# Patient Record
Sex: Female | Born: 1970 | Race: White | Hispanic: No | Marital: Married | Smoking: Never smoker
Health system: Southern US, Community
[De-identification: ages and names within clinical notes are randomized; demographics above are authoritative.]

## PROBLEM LIST (undated history)

## (undated) HISTORY — PX: LEEP: SHX91

## (undated) HISTORY — PX: BIOPSY BREAST: PRO8

---

## 2000-03-03 ENCOUNTER — Other Ambulatory Visit: Admission: RE | Admit: 2000-03-03 | Discharge: 2000-03-03 | Payer: Self-pay | Admitting: Obstetrics and Gynecology

## 2000-09-25 ENCOUNTER — Encounter: Payer: Self-pay | Admitting: *Deleted

## 2000-09-25 ENCOUNTER — Encounter: Admission: RE | Admit: 2000-09-25 | Discharge: 2000-09-25 | Payer: Self-pay | Admitting: *Deleted

## 2001-08-30 ENCOUNTER — Other Ambulatory Visit: Admission: RE | Admit: 2001-08-30 | Discharge: 2001-08-30 | Payer: Self-pay | Admitting: Obstetrics and Gynecology

## 2002-03-08 ENCOUNTER — Other Ambulatory Visit: Admission: RE | Admit: 2002-03-08 | Discharge: 2002-03-08 | Payer: Self-pay | Admitting: Gynecology

## 2002-11-08 ENCOUNTER — Other Ambulatory Visit: Admission: RE | Admit: 2002-11-08 | Discharge: 2002-11-08 | Payer: Self-pay | Admitting: Gynecology

## 2003-05-09 ENCOUNTER — Other Ambulatory Visit: Admission: RE | Admit: 2003-05-09 | Discharge: 2003-05-09 | Payer: Self-pay | Admitting: Gynecology

## 2003-11-14 ENCOUNTER — Other Ambulatory Visit: Admission: RE | Admit: 2003-11-14 | Discharge: 2003-11-14 | Payer: Self-pay | Admitting: Gynecology

## 2004-11-18 ENCOUNTER — Other Ambulatory Visit: Admission: RE | Admit: 2004-11-18 | Discharge: 2004-11-18 | Payer: Self-pay | Admitting: Gynecology

## 2005-11-25 ENCOUNTER — Other Ambulatory Visit: Admission: RE | Admit: 2005-11-25 | Discharge: 2005-11-25 | Payer: Self-pay | Admitting: Gynecology

## 2006-12-10 ENCOUNTER — Other Ambulatory Visit: Admission: RE | Admit: 2006-12-10 | Discharge: 2006-12-10 | Payer: Self-pay | Admitting: Gynecology

## 2007-12-14 ENCOUNTER — Other Ambulatory Visit: Admission: RE | Admit: 2007-12-14 | Discharge: 2007-12-14 | Payer: Self-pay | Admitting: Gynecology

## 2012-07-15 ENCOUNTER — Other Ambulatory Visit: Payer: Self-pay | Admitting: Gynecology

## 2012-07-15 DIAGNOSIS — R928 Other abnormal and inconclusive findings on diagnostic imaging of breast: Secondary | ICD-10-CM

## 2012-07-22 ENCOUNTER — Ambulatory Visit
Admission: RE | Admit: 2012-07-22 | Discharge: 2012-07-22 | Disposition: A | Discharge status: Home / Self Care | Payer: Commercial Indemnity | Source: Ambulatory Visit | Attending: Gynecology | Admitting: Gynecology

## 2012-07-22 DIAGNOSIS — R928 Other abnormal and inconclusive findings on diagnostic imaging of breast: Secondary | ICD-10-CM

## 2012-07-22 IMAGING — MG MM DIGITAL DIAGNOSTIC UNILAT*R*
2 series · 2 of 2 positions shown · non-contrast
Comparison: Previous examinations, including the screening
mammogram dated [DATE].

CLINICAL DATA: Right breast calcifications at recent screening
mammography.

DIGITAL DIAGNOSTIC RIGHT MAMMOGRAM

[R CC]
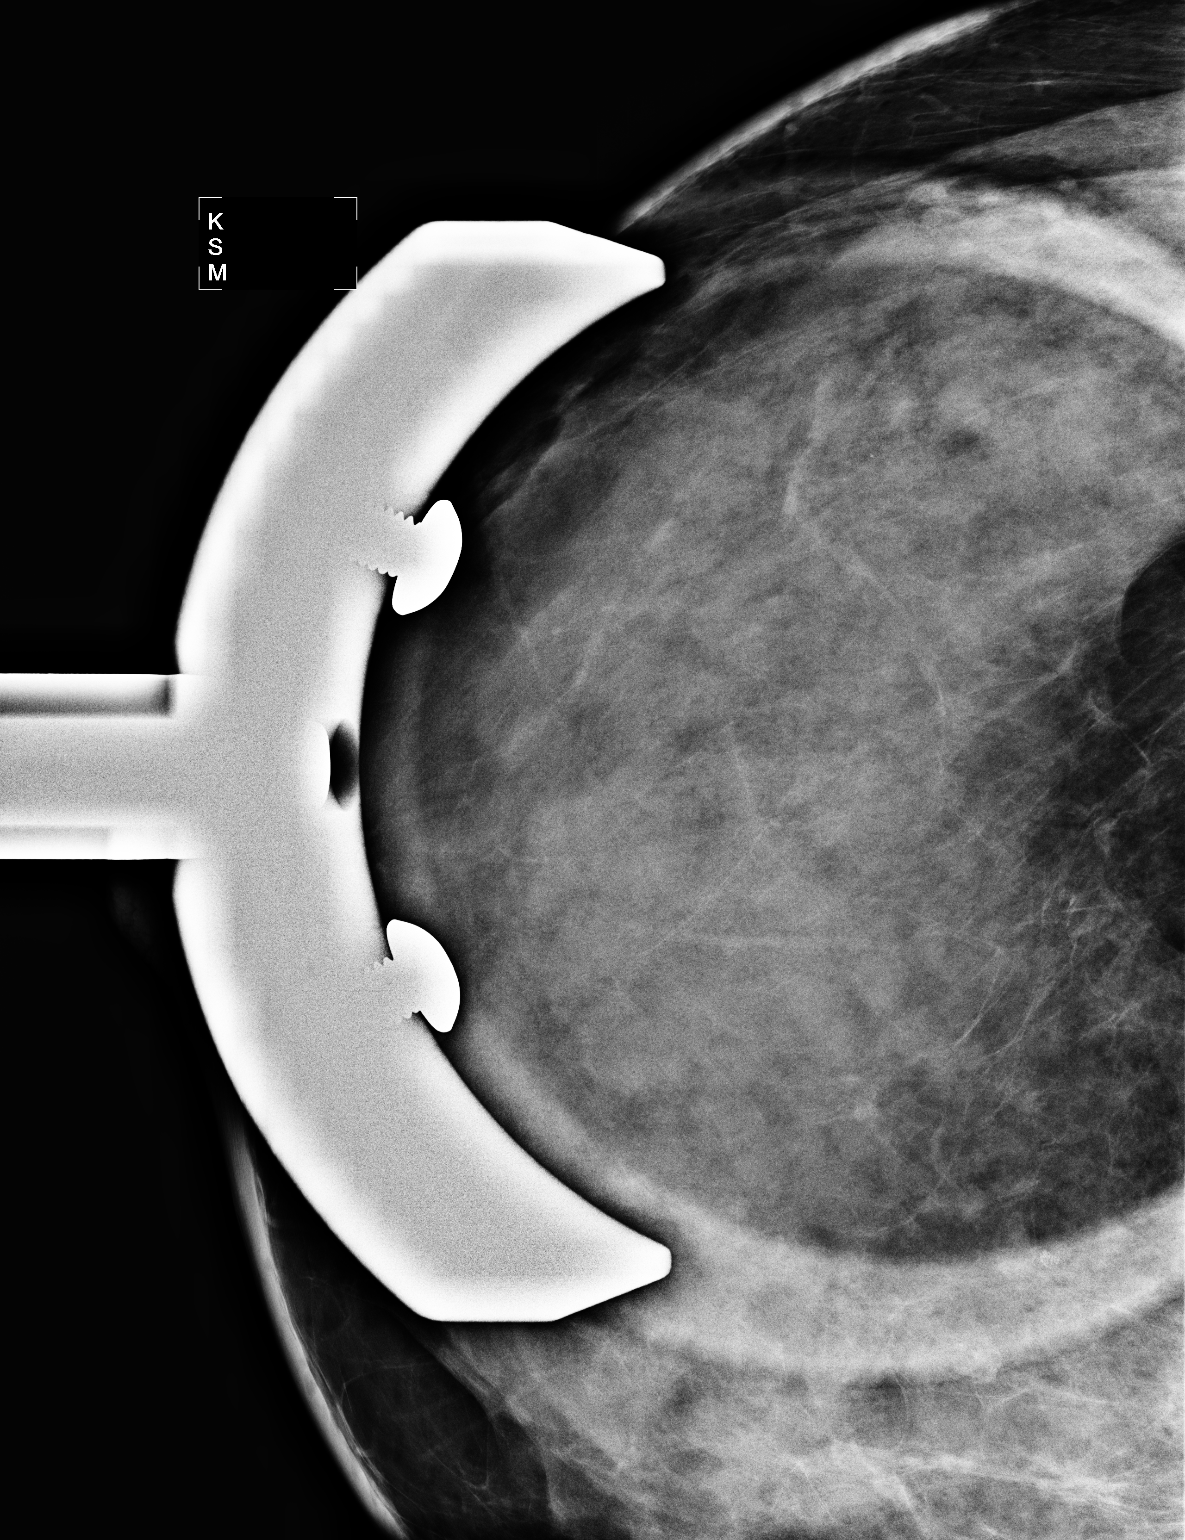

[R ML]
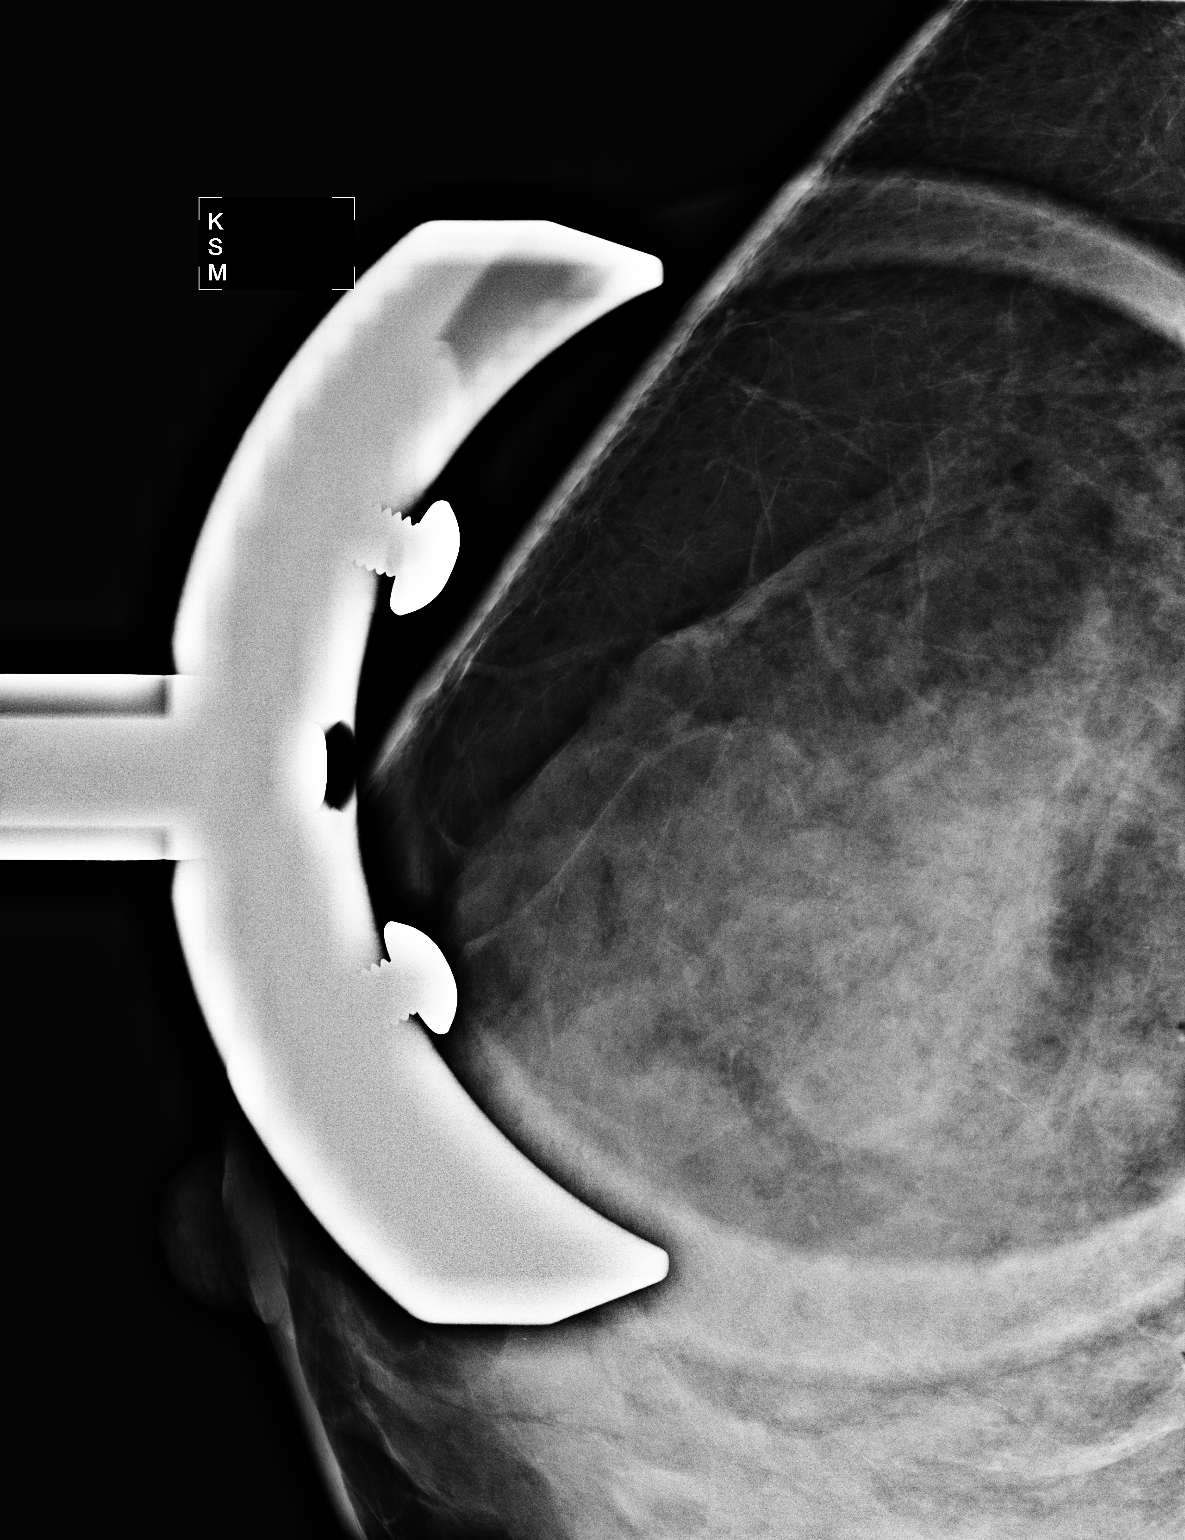

[2 of 2 positions shown; findings below may reference images not displayed]

FINDINGS: Spot magnification views of the right breast demonstrate
faint, loosely clustered, punctate, poorly defined rounded, tiny
calcifications in the upper outer portion of the breast.  These
demonstrate dependent layering on the recent oblique view,
compatible with milk of calcium.  No findings suspicious for
malignancy.
IMPRESSION: Benign right breast calcifications.  No evidence of malignancy.
Annual screening mammography is recommended.

RECOMMENDATION:
Bilateral screening mammogram in 1 year.

BI-RADS CATEGORY 2:  Benign finding(s).

## 2018-09-03 ENCOUNTER — Other Ambulatory Visit: Payer: Self-pay | Admitting: Obstetrics & Gynecology

## 2018-09-03 DIAGNOSIS — R921 Mammographic calcification found on diagnostic imaging of breast: Secondary | ICD-10-CM

## 2018-09-10 ENCOUNTER — Ambulatory Visit
Admission: RE | Admit: 2018-09-10 | Discharge: 2018-09-10 | Disposition: A | Discharge status: Home / Self Care | Payer: Managed Care, Other (non HMO) | Source: Ambulatory Visit | Attending: Obstetrics & Gynecology | Admitting: Obstetrics & Gynecology

## 2018-09-10 DIAGNOSIS — R921 Mammographic calcification found on diagnostic imaging of breast: Secondary | ICD-10-CM

## 2018-09-10 IMAGING — MG MM CLIP PLACEMENT
2 series · 2 of 2 positions shown · non-contrast
Comparison: Previous exam(s).

CLINICAL DATA: Calcifications were biopsied in the upper inner left
breast.

EXAM:
DIAGNOSTIC LEFT MAMMOGRAM POST STEREOTACTIC BIOPSY

[L CC]
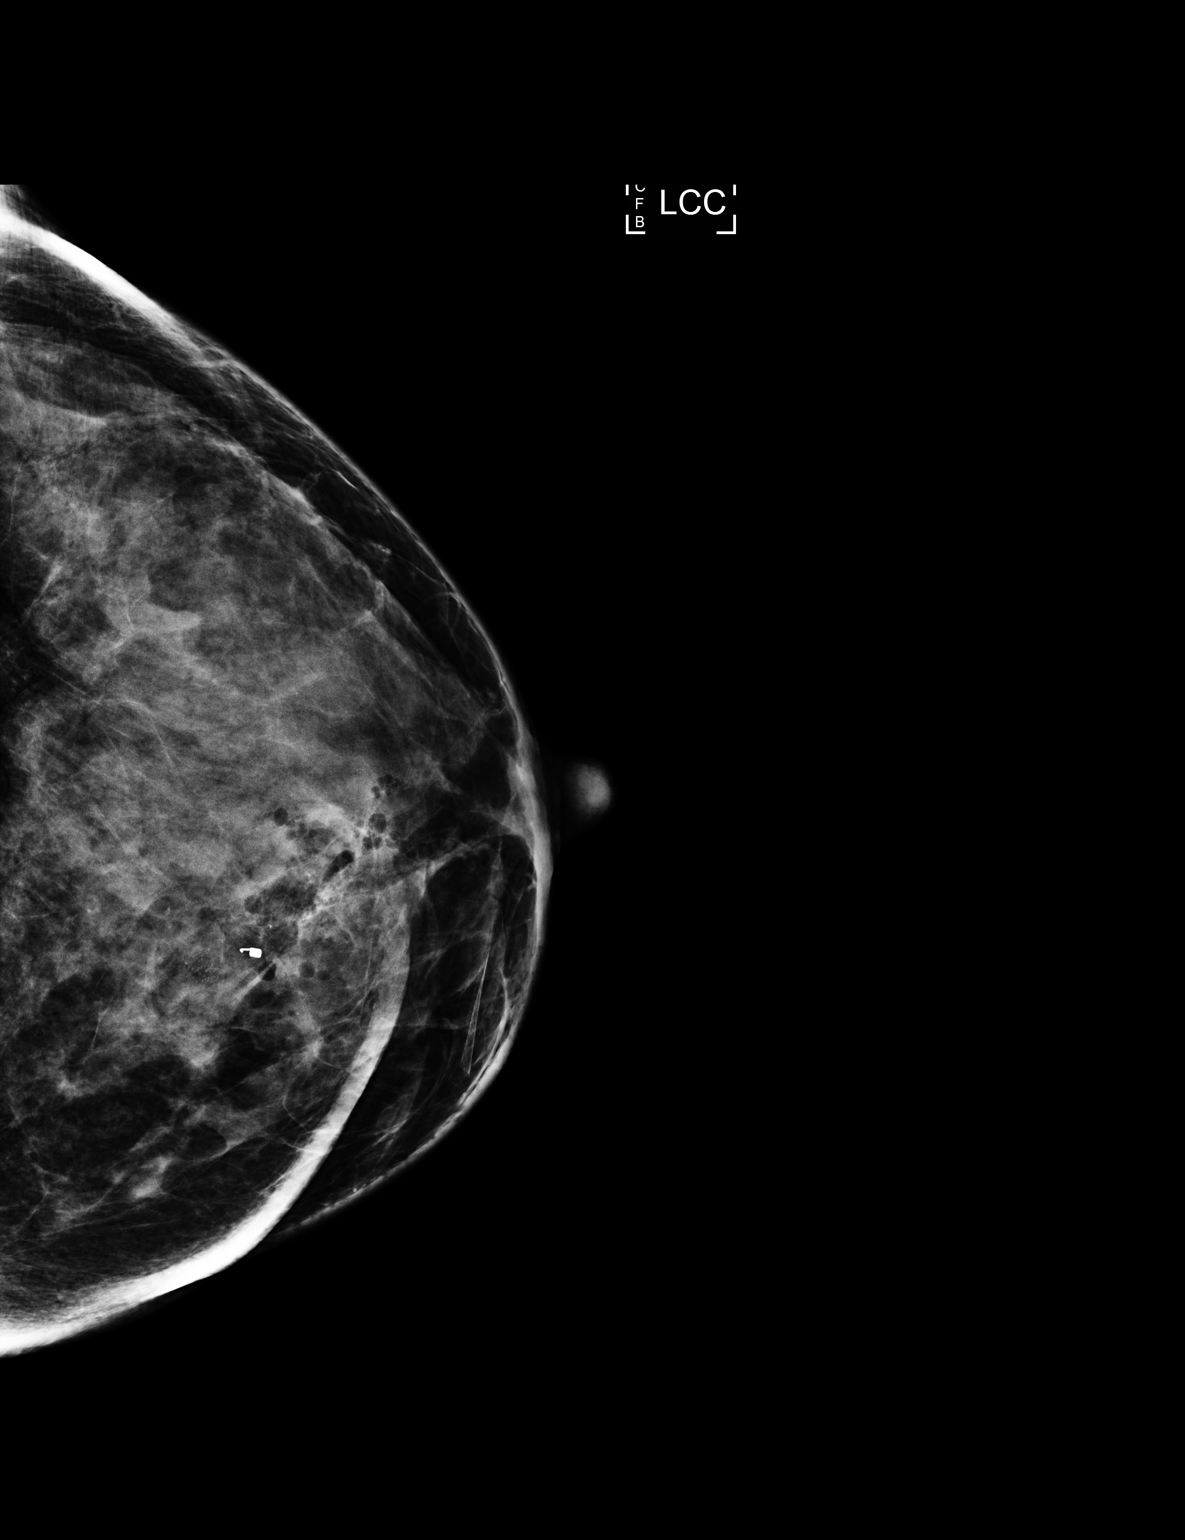

[L ML]
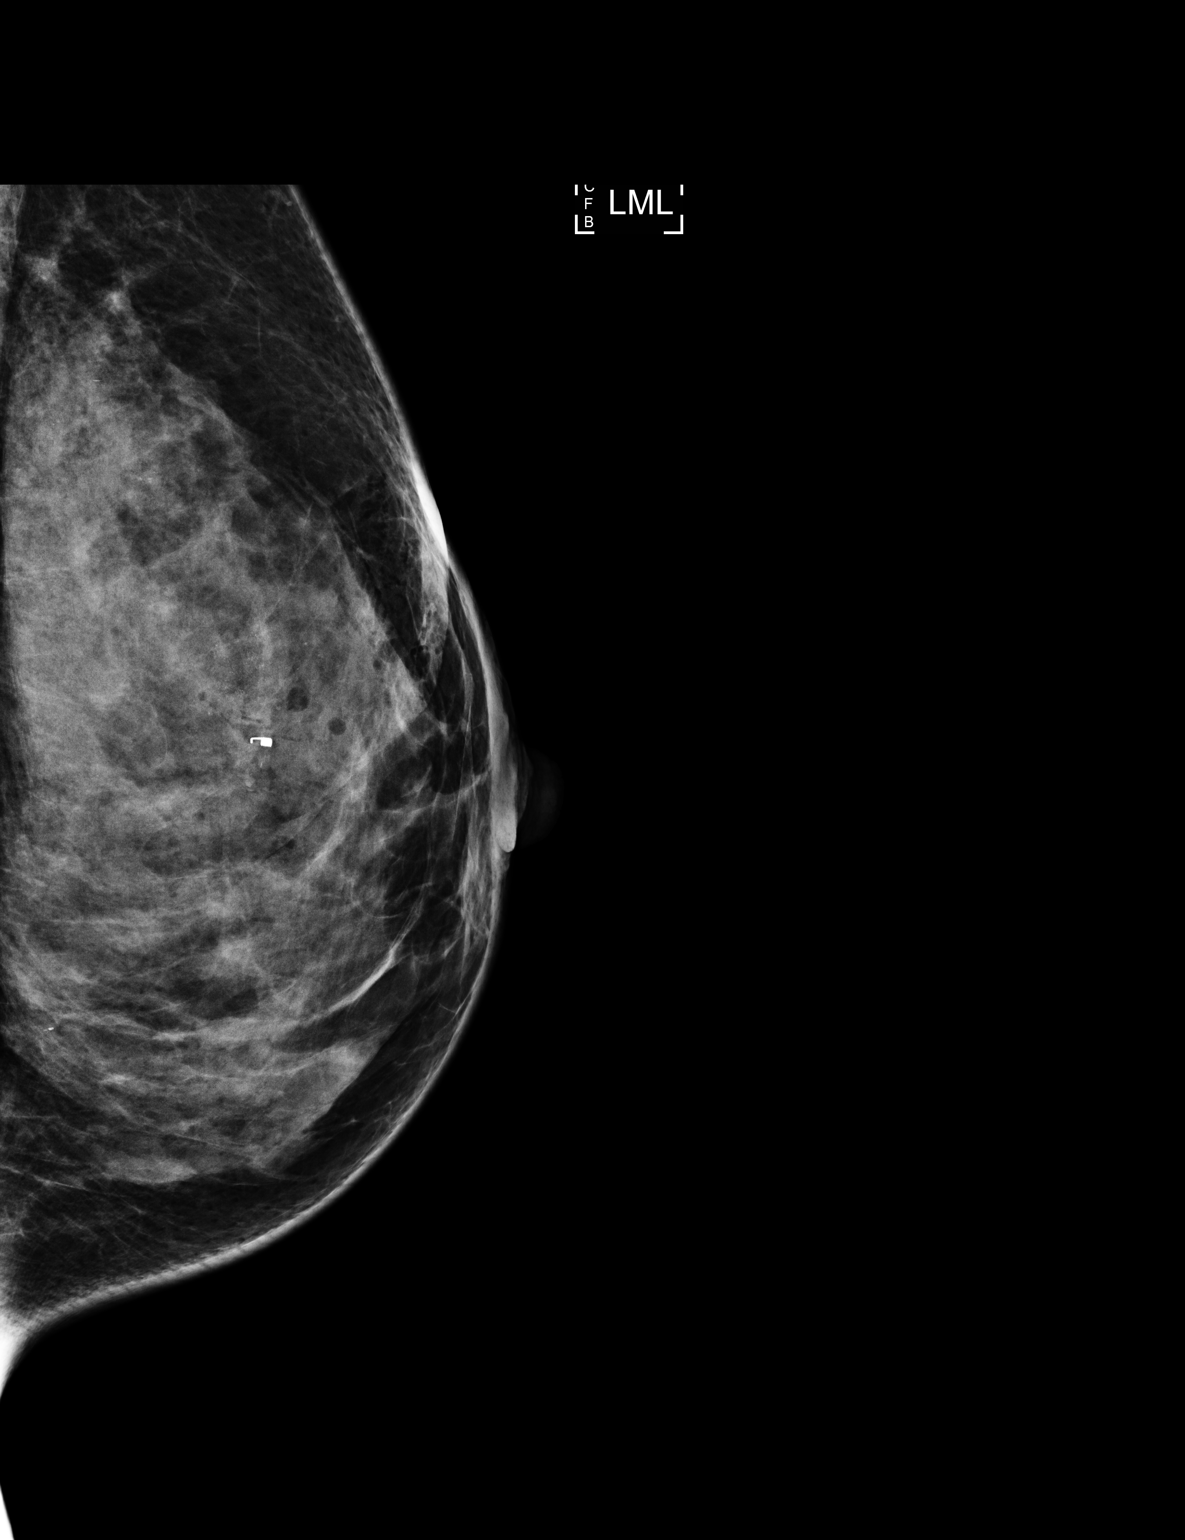

[2 of 2 positions shown; findings below may reference images not displayed]

FINDINGS: Mammographic images were obtained following stereotactic guided
biopsy of calcifications in the slightly upper inner left breast.
The coil shaped biopsy clip is in satisfactory position.
IMPRESSION: Satisfactory position of coil shaped biopsy clip in the left breast.

Final Assessment: Post Procedure Mammograms for Marker Placement

## 2018-09-10 IMAGING — MG STEREOTACTIC CORE NEEDLE BIOPSY
8 of 11 series · 8 of 15 positions shown · non-contrast
Comparison: Previous exams.

Addendum:
CLINICAL DATA: Stereotactic biopsy was performed of calcifications
in the slightly upper and inner left breast.

EXAM:
LEFT BREAST STEREOTACTIC CORE NEEDLE BIOPSY

[L (1 of 5)]
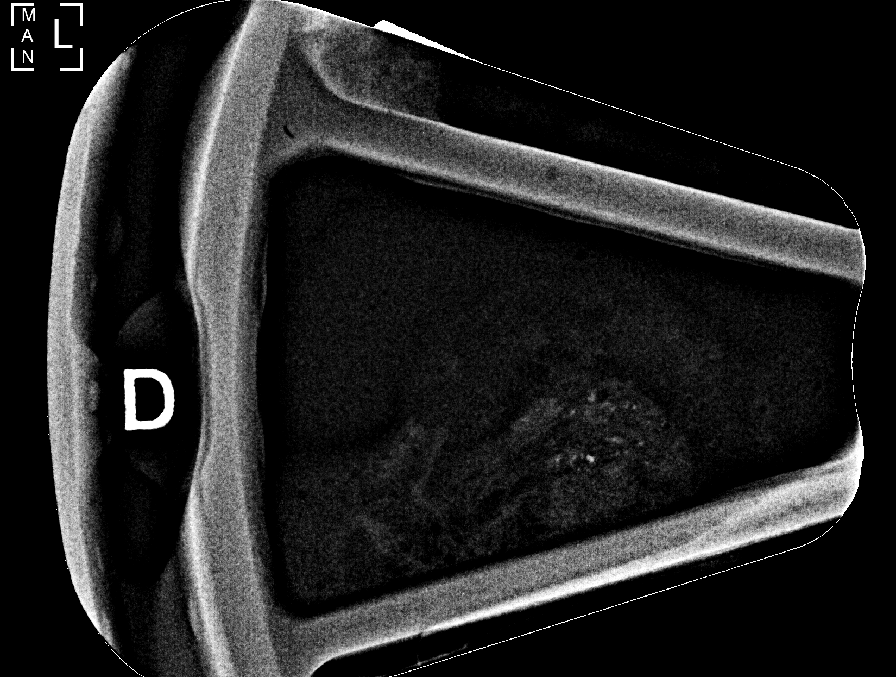

[L (2 of 5)]
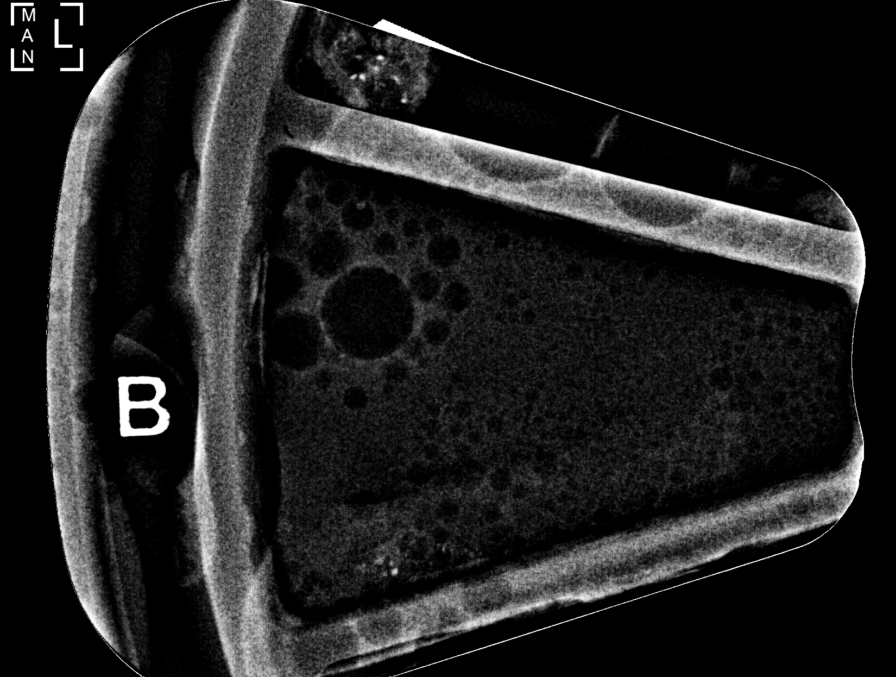

[L (3 of 5)]
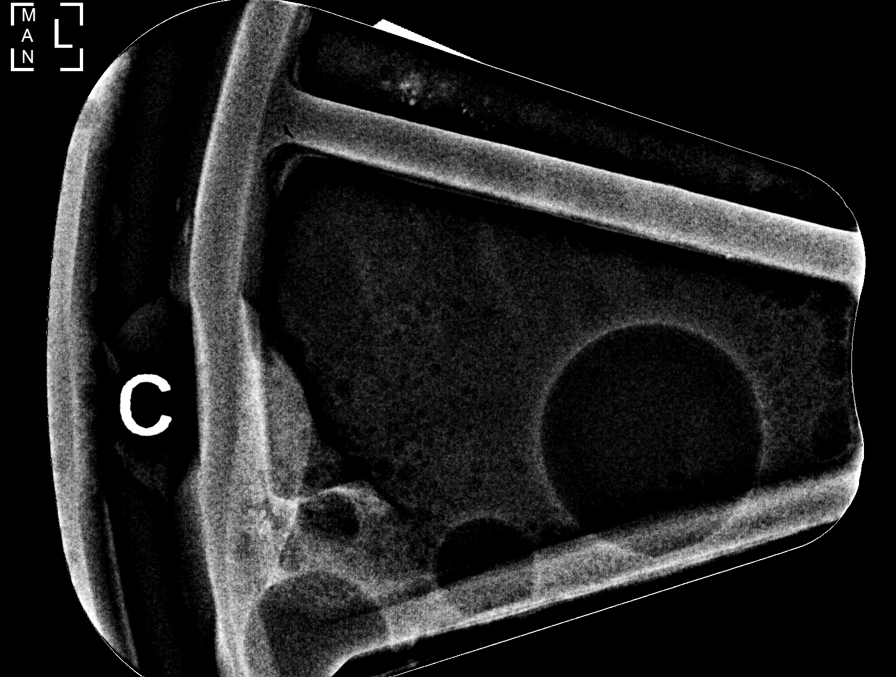

[L (4 of 5)]
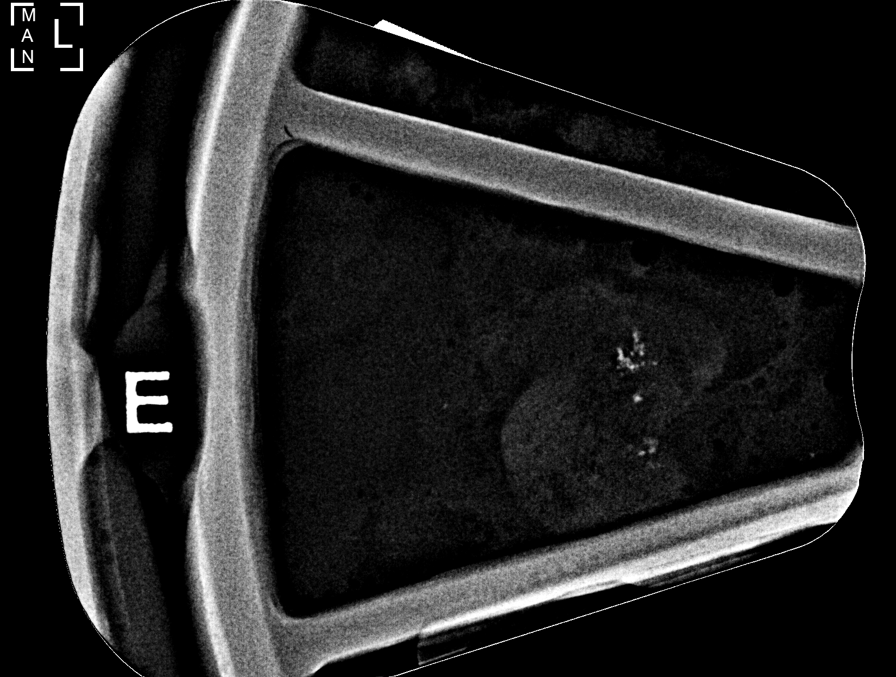

[L (5 of 5)]
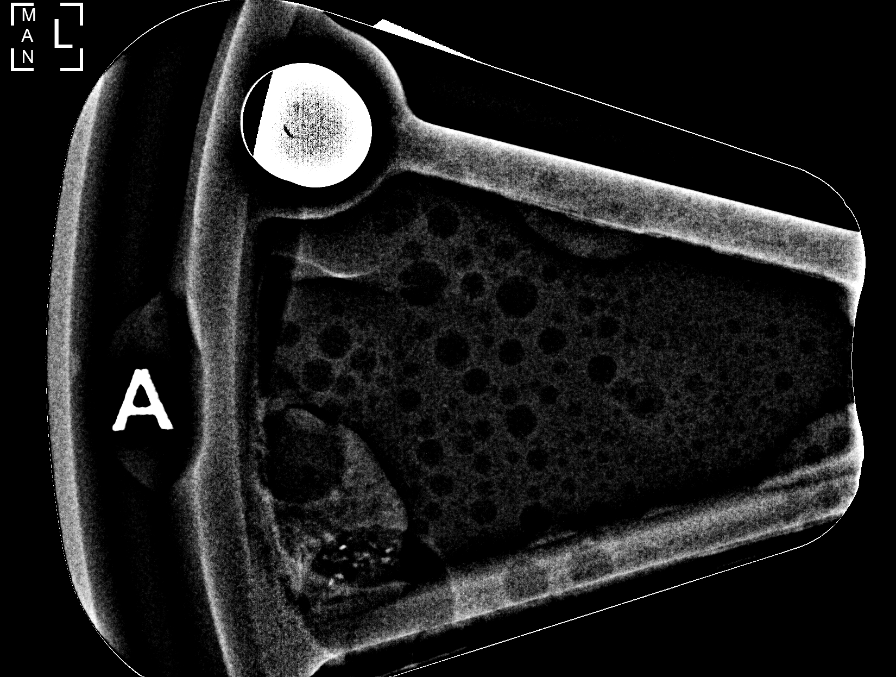

[L ML (1 of 3)]
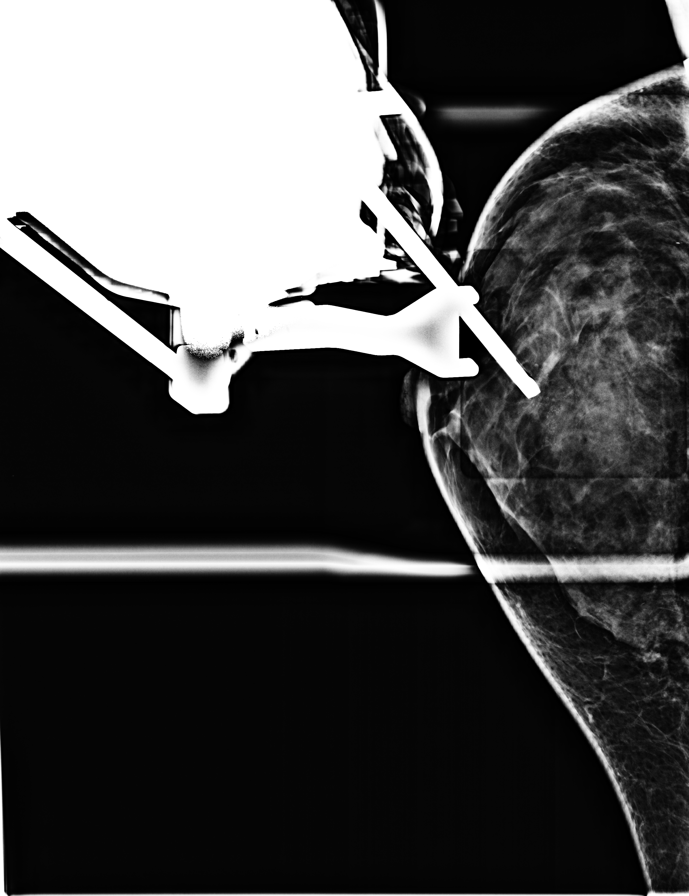

[L ML (2 of 3)]
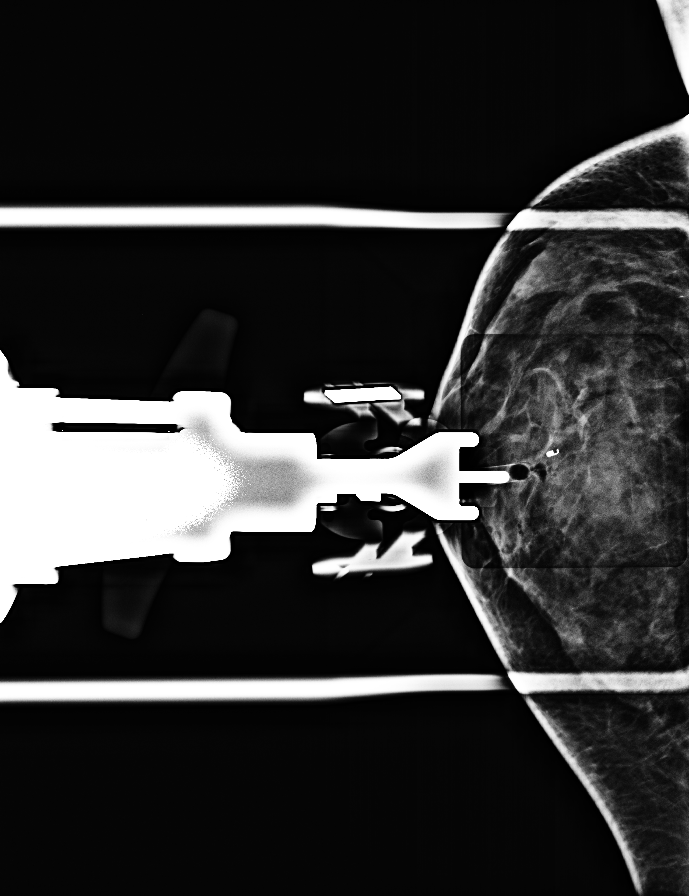

[L ML (3 of 3)]
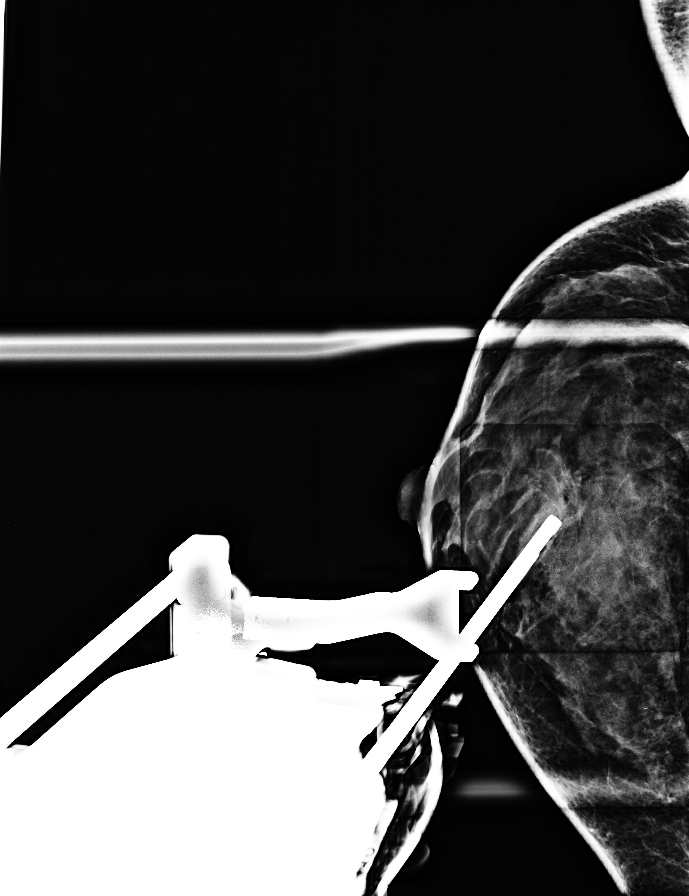

[8 of 15 positions shown; findings below may reference images not displayed]



Using sterile technique and 1% Lidocaine with and without
epinephrine as local anesthetic, under stereotactic guidance, a 9
gauge vacuum assisted device was used to perform core needle biopsy
of calcifications in the upper inner quadrant using a medial
approach. Specimen radiograph was performed showing multiple
calcifications. Specimens with calcifications are identified for
pathology.

Lesion quadrant: Upper inner quadrant

At the conclusion of the procedure, a coil tissue marker clip was
deployed into the biopsy cavity. Follow-up 2-view mammogram was
performed and dictated separately.
IMPRESSION: Stereotactic-guided biopsy of the left breast. No apparent
complications.

ADDENDUM:
Pathology revealed FIBROCYSTIC CHANGES WITH CALCIFICATIONS of the
Left breast, 9 o'clock region. This was found to be concordant by
Dr. AVEJI.

Pathology results were discussed with the patient by telephone by
AVEJI, [REDACTED] at [facility] [REDACTED]. The patient reported doing well after the biopsy with
tenderness at the site.

The patient was instructed to return for annual screening
mammography at [REDACTED] in [HOSPITAL][HOSPITAL].

Pathology results reported by AVEJI, RN on [DATE].

*** End of Addendum ***

## 2020-02-23 ENCOUNTER — Other Ambulatory Visit: Payer: Self-pay | Admitting: Obstetrics & Gynecology

## 2020-02-23 DIAGNOSIS — R922 Inconclusive mammogram: Secondary | ICD-10-CM

## 2020-03-12 ENCOUNTER — Ambulatory Visit
Admission: RE | Admit: 2020-03-12 | Discharge: 2020-03-12 | Disposition: A | Discharge status: Home / Self Care | Payer: No Typology Code available for payment source | Source: Ambulatory Visit | Attending: Obstetrics & Gynecology | Admitting: Obstetrics & Gynecology

## 2020-03-12 DIAGNOSIS — R922 Inconclusive mammogram: Secondary | ICD-10-CM

## 2020-03-12 IMAGING — MR MR BREAST WO/W CM  BILAT
5 series · 30 of 48 positions shown · IV contrast (gadavist)
Comparison: Prior mammograms.  [DATE] abdominal MR

CLINICAL DATA: 48-year-old female for abbreviated Breast MRI for
breast cancer screening.

LABS:  None performed today
EXAM:
BILATERAL ABBREVIATED BREAST MRI WITH AND WITHOUT CONTRAST
TECHNIQUE: Multiplanar, multisequence MR images of both breasts were obtained
prior to and following the intravenous administration of 7 ml of
Gadavist

[Series 2: t2_tirm_tra ipat (a-p) · axial · 3.0mm · 0.59mm/px · z∈[-71,+106]mm · 5 of 60 slices shown]
[im 1/60]
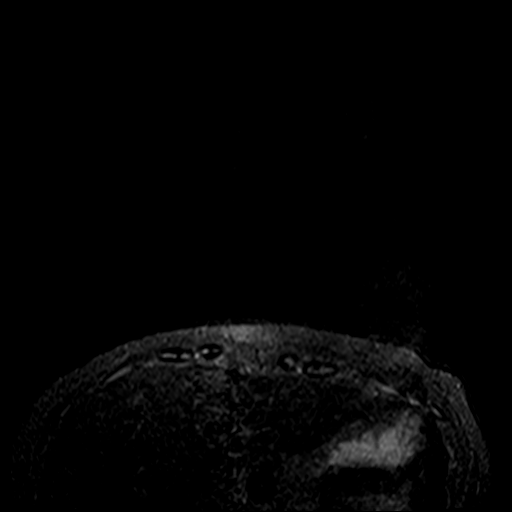
[im 15/60]
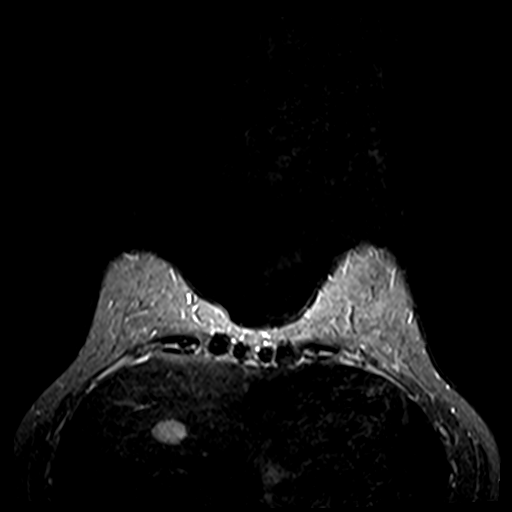
[im 30/60]
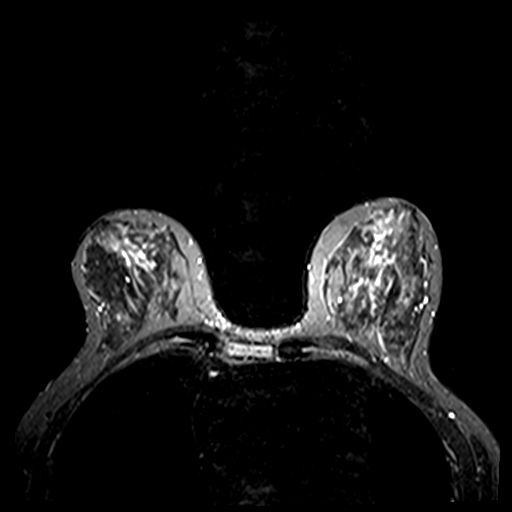
[im 45/60]
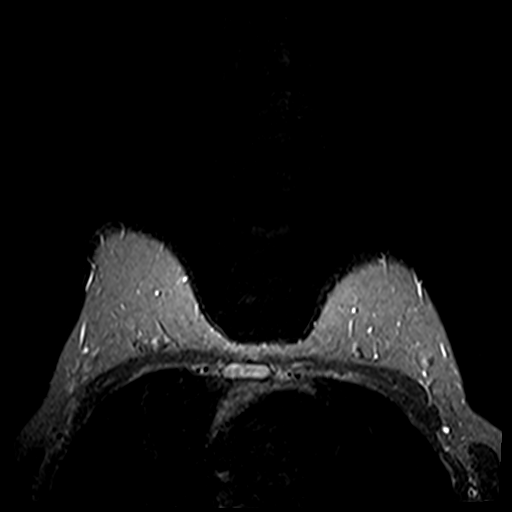
[im 60/60]
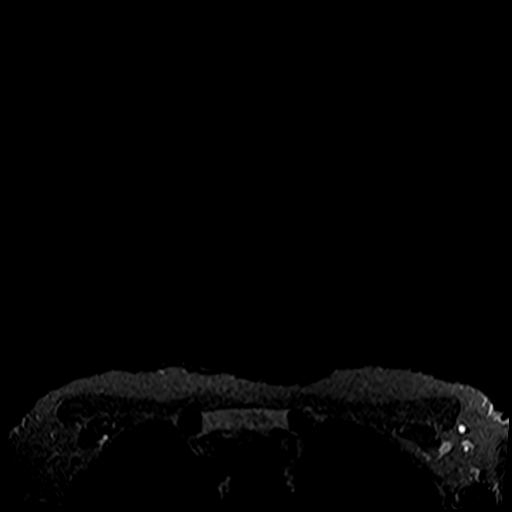

[Series 3: fl3d pre-cm · axial · non-contrast · 1.2mm · 0.78mm/px · z∈[-69,+103]mm · 8 of 144 slices shown]
[im 1/144]
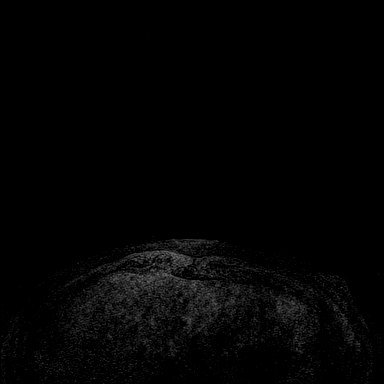
[im 23/144]
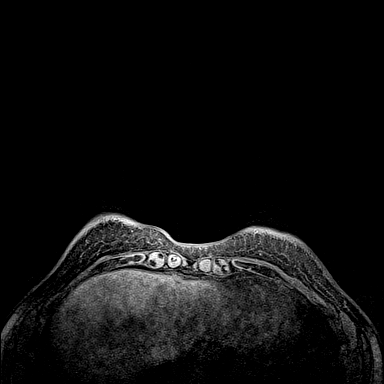
[im 45/144]
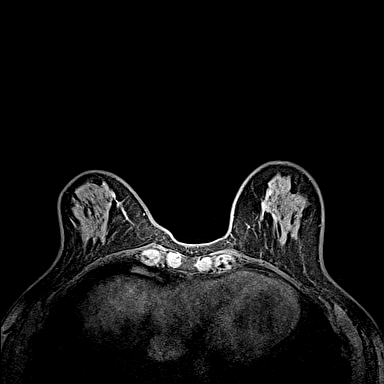
[im 67/144]
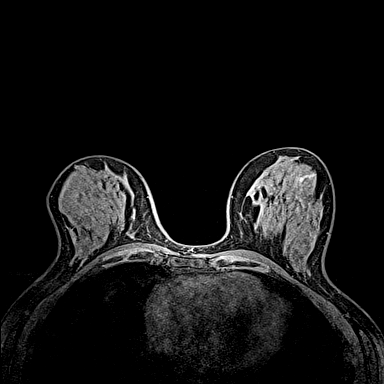
[im 78/144]
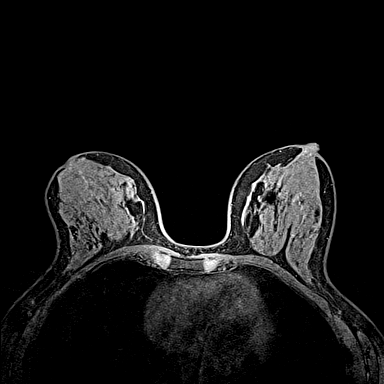
[im 100/144]
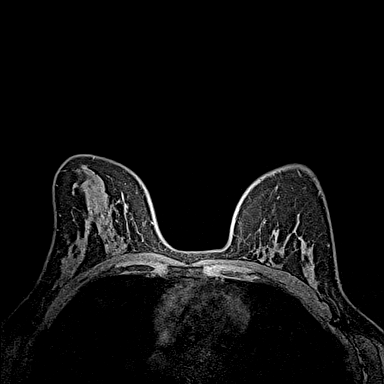
[im 122/144]
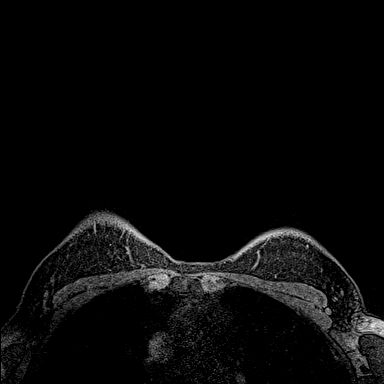
[im 144/144]
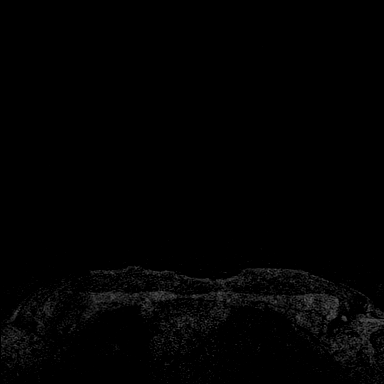

[Series 4: fl3d post-cm 20 · axial · 1.2mm · 0.78mm/px · z∈[-69,+103]mm · 8 of 144 slices shown (1 of 3)]
[im 1/144]
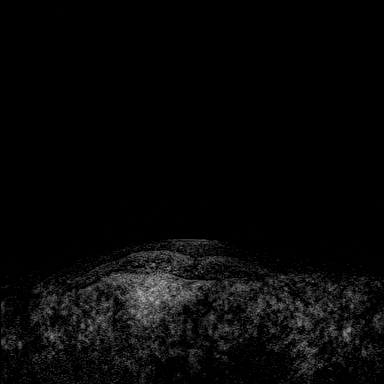
[im 23/144]
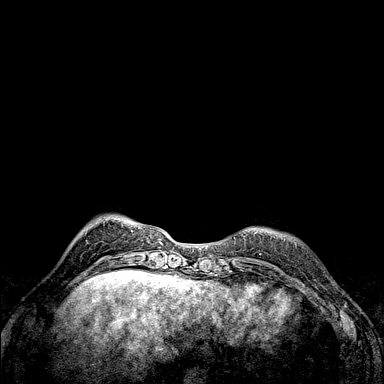
[im 45/144]
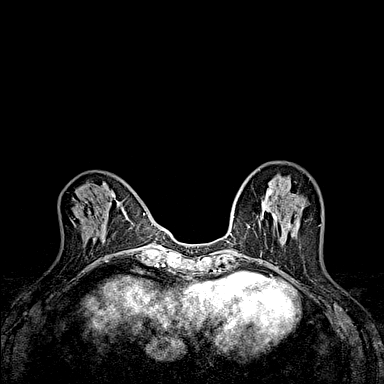
[im 67/144]
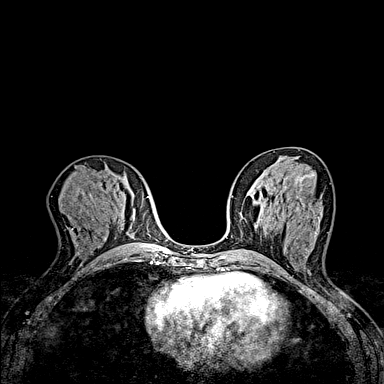
[im 78/144]
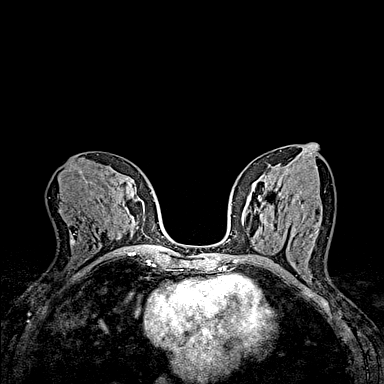
[im 100/144]
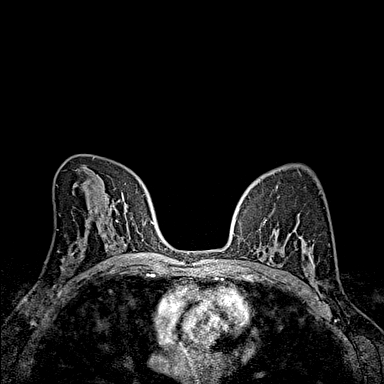
[im 122/144]
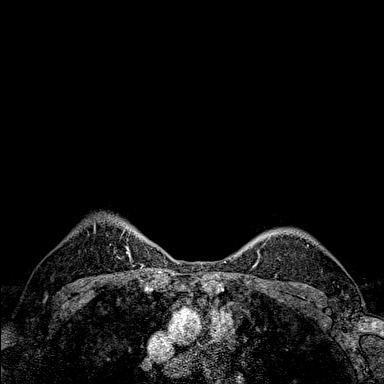
[im 144/144]
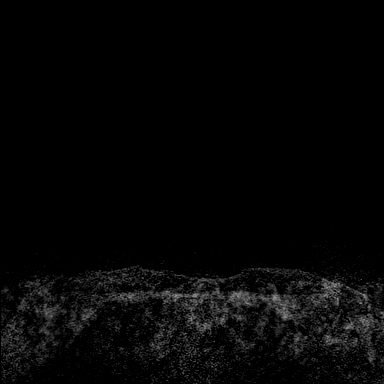

[Series 5: fl3d post-cm 20 · axial · 1.2mm · 0.78mm/px · z∈[-69,+103]mm · 8 of 144 slices shown (2 of 3)]
[im 1/144]
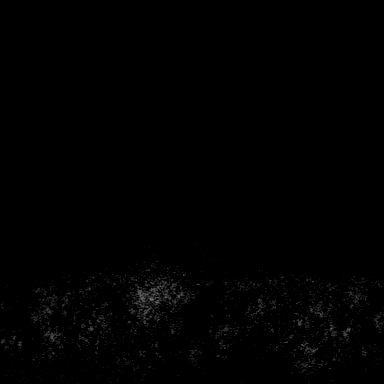
[im 23/144]
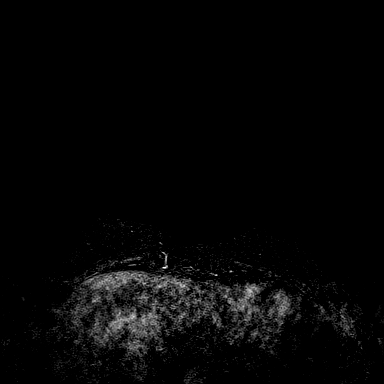
[im 45/144]
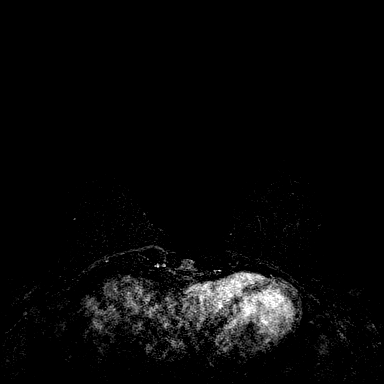
[im 67/144]
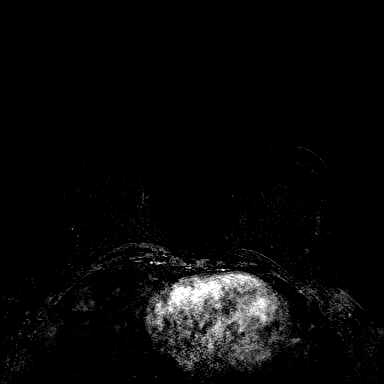
[im 78/144]
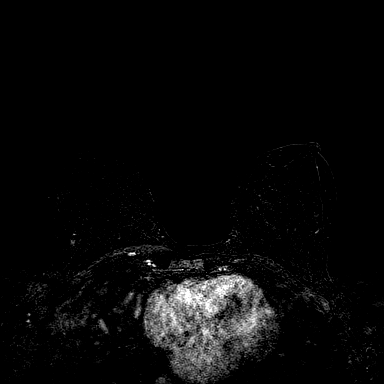
[im 100/144]
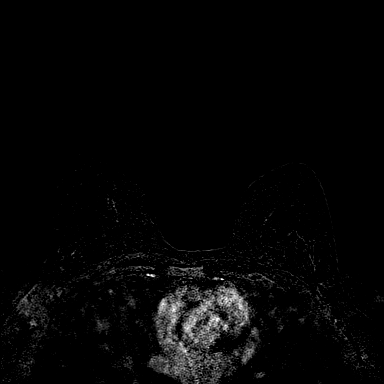
[im 122/144]
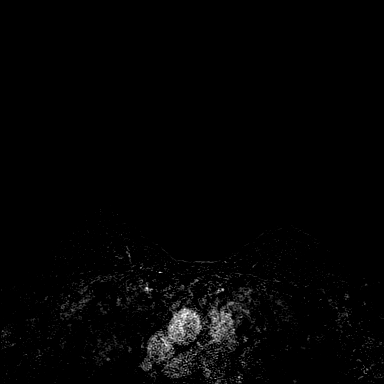
[im 144/144]
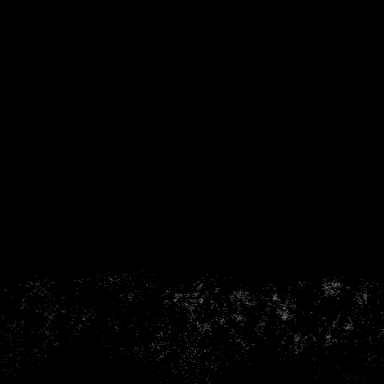

[Series 6: fl3d post-cm 20 · axial · 172.8mm · 0.78mm/px · 1 of 1 slices shown (3 of 3)]
[im 1/1]
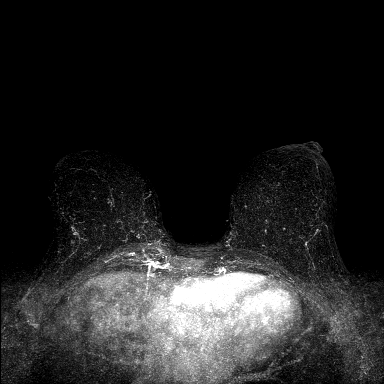

[30 of 48 positions shown; findings below may reference images not displayed]

Three-dimensional MR images were rendered by post-processing of the
original MR data on an independent workstation. The
three-dimensional MR images were interpreted, and findings are
reported in the following complete MRI report for this study. Three
dimensional images were evaluated at the independent [employer]
workstation
FINDINGS: Breast composition: d. Extreme fibroglandular tissue.

Background parenchymal enhancement: Moderate.

Right breast: No suspicious mass or abnormal enhancement. Multiple
small cysts are noted.

Left breast: No suspicious mass or abnormal enhancement. Multiple
small cysts are noted. Biopsy clip artifact within the INNER LEFT
breast identified.

Lymph nodes: No abnormal appearing lymph nodes.

Ancillary findings: A hemangioma within the central liver is again
identified.
IMPRESSION: No MR evidence of breast malignancy.

RECOMMENDATION:
Recommend annual screening mammography. The patient is also eligible
for annual Abbreviated Breast MRI if she wishes

BI-RADS CATEGORY  2: Benign.

## 2020-03-12 MED ORDER — GADOBUTROL 1 MMOL/ML IV SOLN
7.0000 mL | Freq: Once | INTRAVENOUS | Status: AC | PRN
  Administered 2020-03-12: 7 mL via INTRAVENOUS

## 2021-01-31 ENCOUNTER — Other Ambulatory Visit: Payer: Self-pay | Admitting: Obstetrics & Gynecology

## 2021-02-01 ENCOUNTER — Other Ambulatory Visit: Payer: Self-pay | Admitting: Obstetrics & Gynecology

## 2021-02-01 DIAGNOSIS — R922 Inconclusive mammogram: Secondary | ICD-10-CM

## 2021-03-06 ENCOUNTER — Ambulatory Visit
Admission: RE | Admit: 2021-03-06 | Discharge: 2021-03-06 | Disposition: A | Discharge status: Home / Self Care | Payer: No Typology Code available for payment source | Source: Ambulatory Visit | Attending: Obstetrics & Gynecology | Admitting: Obstetrics & Gynecology

## 2021-03-06 ENCOUNTER — Other Ambulatory Visit: Payer: Self-pay

## 2021-03-06 DIAGNOSIS — R922 Inconclusive mammogram: Secondary | ICD-10-CM

## 2021-03-06 IMAGING — MR MR BREAST WO/W CM  BILAT
4 of 5 series · 29 of 48 positions shown · IV contrast (8ML GADAVIST)
Comparison: Previous exams.

CLINICAL DATA: Abbreviated Breast MRI for breast cancer screening.
49-year-old female with family history of breast cancer, including
in an aunt as well as several great aunts with history of
postmenopausal breast cancer. Benign stereotactic guided biopsy of
left breast calcifications [3P] with pathology revealing fibrocystic
change.

LABS:  Not applicable.
EXAM:
BILATERAL ABBREVIATED BREAST MRI WITH AND WITHOUT CONTRAST
TECHNIQUE: Multiplanar, multisequence MR images of both breasts were obtained
prior to and following the intravenous administration of 8 ml of
Gadavist

[Series 2: t2_tirm_tra ipat (a-p) · axial · 3.0mm · 0.70mm/px · z∈[-79,+83]mm · 5 of 55 slices shown]
[im 1/55]
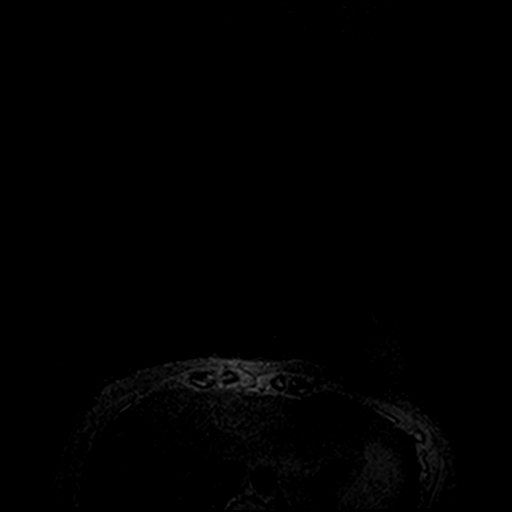
[im 14/55]
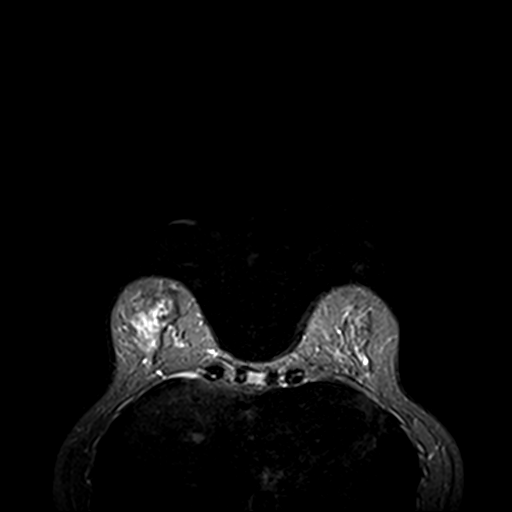
[im 28/55]
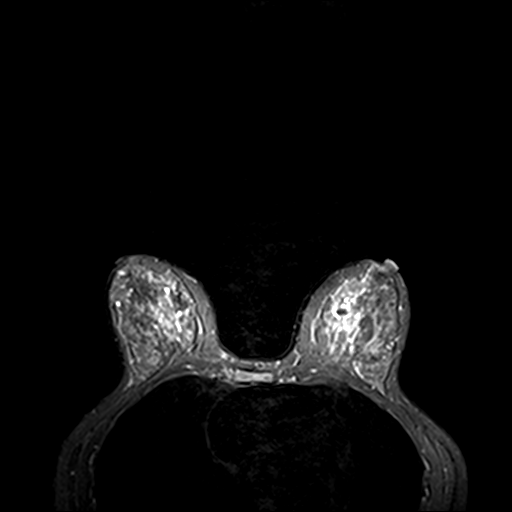
[im 41/55]
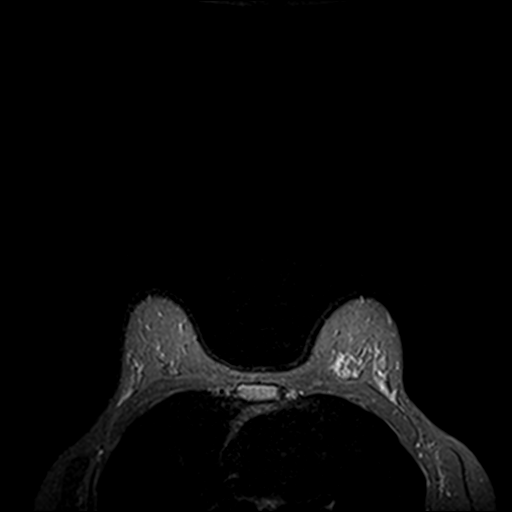
[im 55/55]
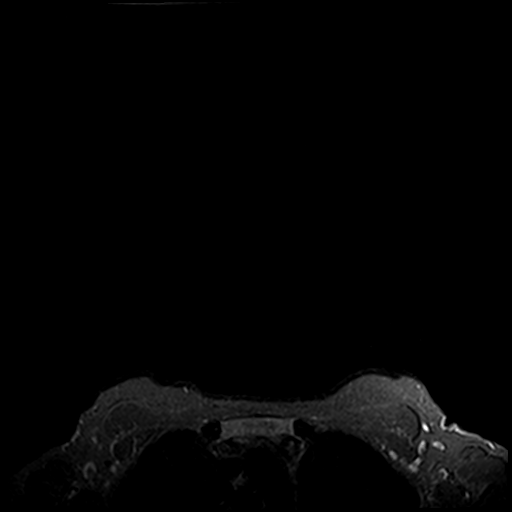

[Series 3: fl3d pre-cm · axial · non-contrast · 1.2mm · 0.94mm/px · z∈[-80,+91]mm · 8 of 144 slices shown]
[im 1/144]
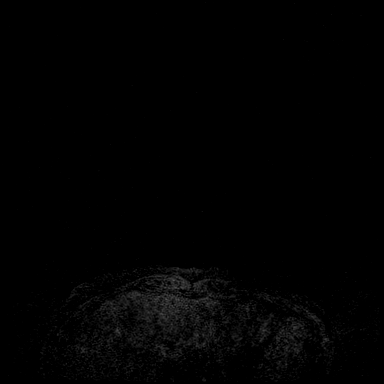
[im 23/144]
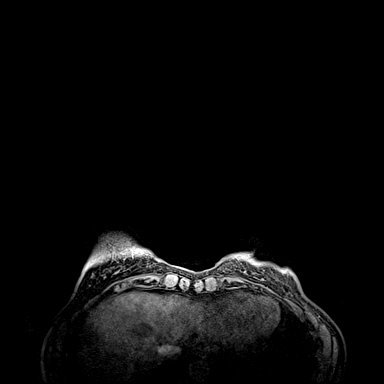
[im 45/144]
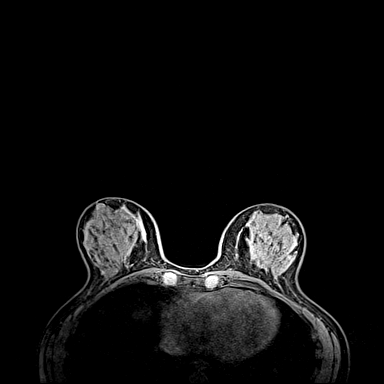
[im 67/144]
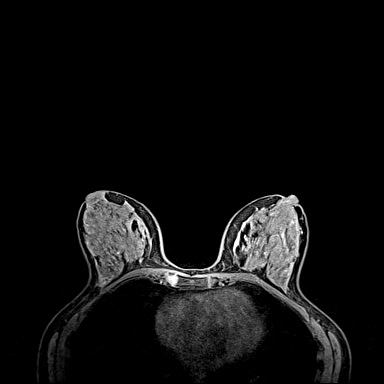
[im 78/144]
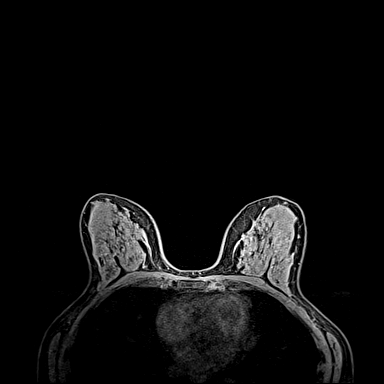
[im 100/144]
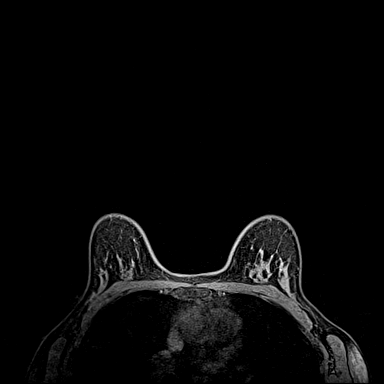
[im 122/144]
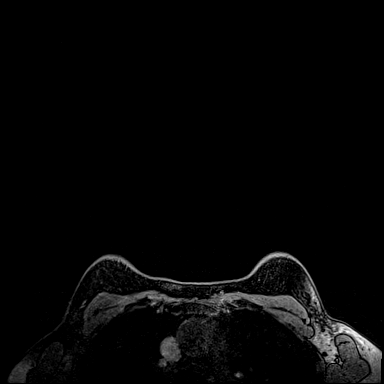
[im 144/144]
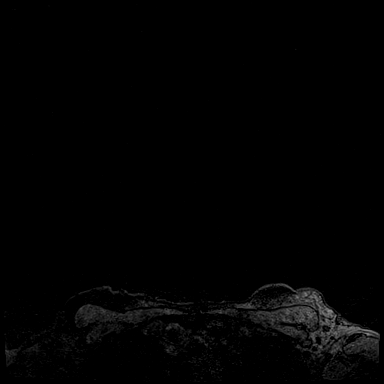

[Series 4: fl3d post immediate · axial · 1.2mm · 0.94mm/px · z∈[-80,+91]mm · 8 of 144 slices shown (1 of 2)]
[im 1/144]
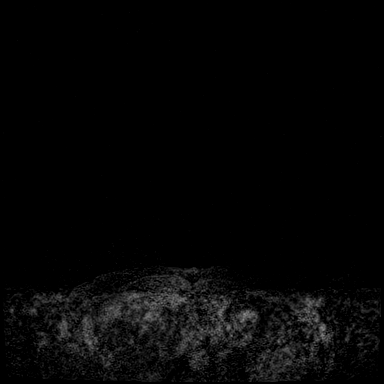
[im 23/144]
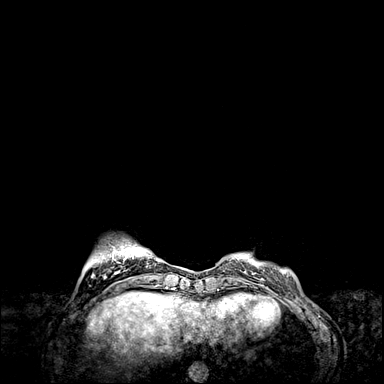
[im 45/144]
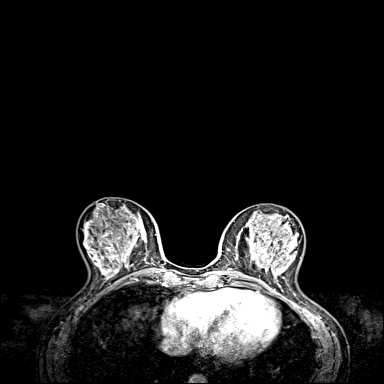
[im 67/144]
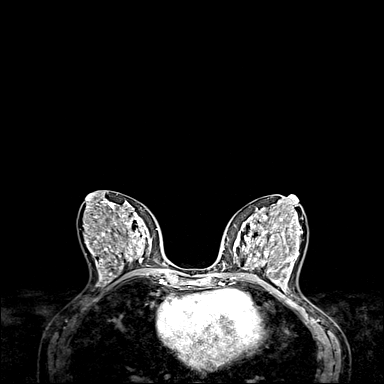
[im 78/144]
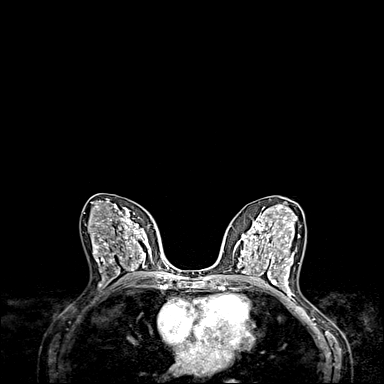
[im 100/144]
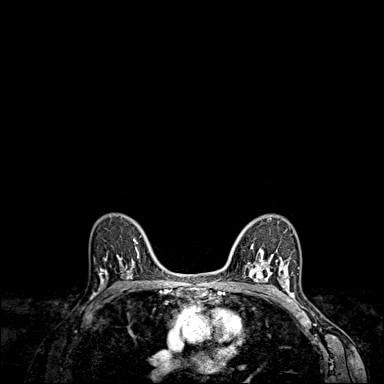
[im 122/144]
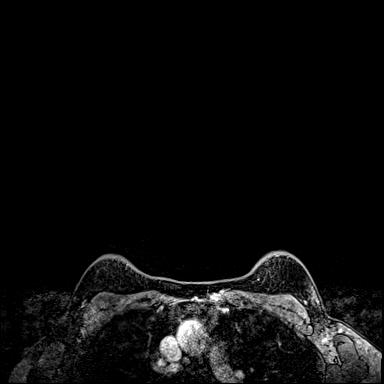
[im 144/144]
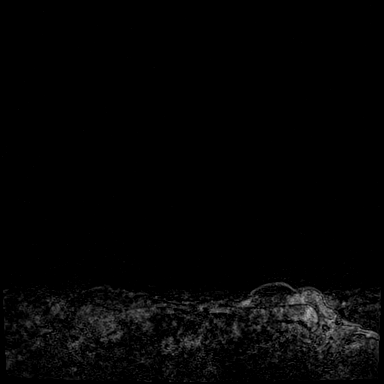

[Series 5: fl3d post immediate · axial · 1.2mm · 0.94mm/px · z∈[-80,+91]mm · 8 of 144 slices shown (2 of 2)]
[im 1/144]
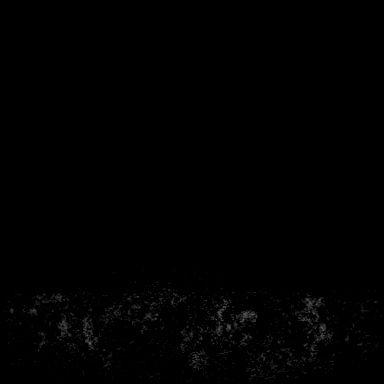
[im 23/144]
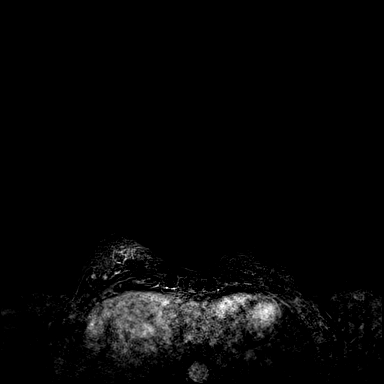
[im 45/144]
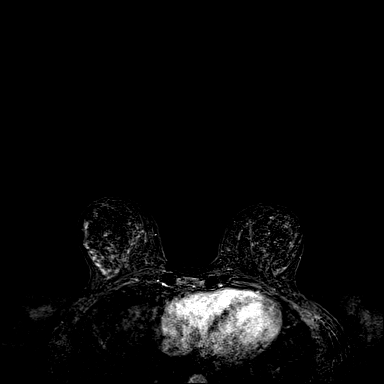
[im 67/144]
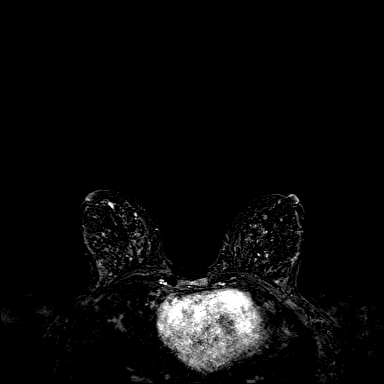
[im 78/144]
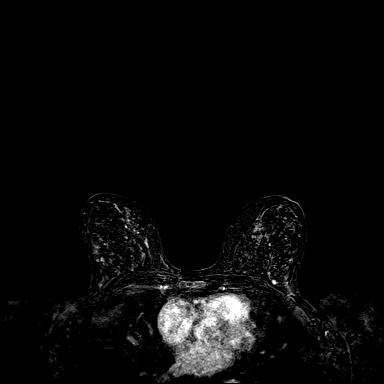
[im 100/144]
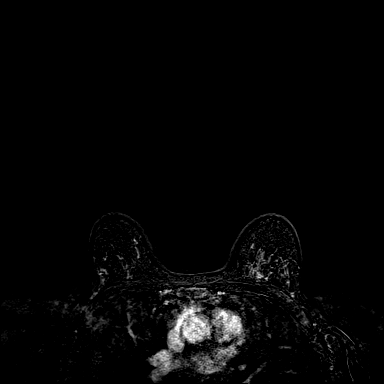
[im 122/144]
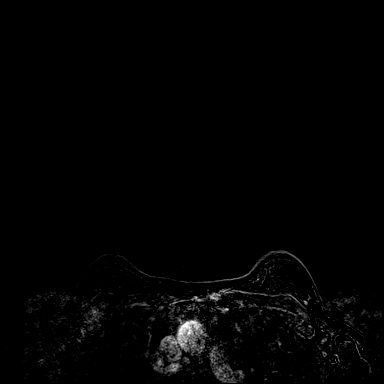
[im 144/144]
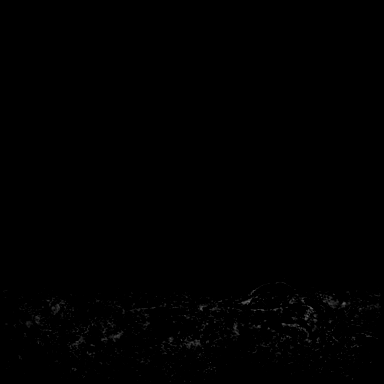

[29 of 48 positions shown; findings below may reference images not displayed]

Three-dimensional MR images were rendered by post-processing of the
original MR data on an independent workstation. The
three-dimensional MR images were interpreted, and findings are
reported in the following complete MRI report for this study. Three
dimensional images were evaluated at the independent [employer]
workstation
FINDINGS: Breast composition: d.  Extreme fibroglandular tissue.

Background parenchymal enhancement: Marked. Bilateral T2 bright
masses again identified compatible with cysts.

Right breast: There is marked background enhancement of the breast
parenchyma with multiple areas of enhancing nodularity which are
considered benign given multiplicity and bilaterality.

Left breast: There is marked background enhancement of the breast
parenchyma with multiple areas of enhancing nodularity which are
considered benign given multiplicity and bilaterality. Biopsy clip
artifact again visualized in the upper inner left breast at site of
prior benign stereotactic guided biopsy.

Lymph nodes: No abnormal appearing lymph nodes.

Ancillary findings:  Unchanged appearance of hepatic  hemangioma.
IMPRESSION: 1.  No MRI evidence of malignancy in either breast.

2. Marked background enhancement of the breast parenchyma which
decreases sensitivity for detection of malignancy.

RECOMMENDATION:
Recommend annual screening mammography. The patient is also eligible
for annual Abbreviated Breast MRI if she wishes.

BI-RADS CATEGORY  2: Benign.

## 2021-03-06 MED ORDER — GADOBUTROL 1 MMOL/ML IV SOLN
8.0000 mL | Freq: Once | INTRAVENOUS | Status: AC | PRN
  Administered 2021-03-06: 8 mL via INTRAVENOUS

## 2021-09-06 ENCOUNTER — Other Ambulatory Visit: Payer: Self-pay

## 2021-09-06 ENCOUNTER — Emergency Department (INDEPENDENT_AMBULATORY_CARE_PROVIDER_SITE_OTHER)
Admission: EM | Admit: 2021-09-06 | Discharge: 2021-09-06 | Disposition: A | Discharge status: Home / Self Care | Payer: Managed Care, Other (non HMO) | Source: Home / Self Care

## 2021-09-06 DIAGNOSIS — R509 Fever, unspecified: Secondary | ICD-10-CM | POA: Diagnosis not present

## 2021-09-06 DIAGNOSIS — J309 Allergic rhinitis, unspecified: Secondary | ICD-10-CM

## 2021-09-06 DIAGNOSIS — J3489 Other specified disorders of nose and nasal sinuses: Secondary | ICD-10-CM

## 2021-09-06 LAB — POCT INFLUENZA A/B
Influenza A, POC: NEGATIVE
Influenza B, POC: NEGATIVE

## 2021-09-06 LAB — POC SARS CORONAVIRUS 2 AG -  ED: SARS Coronavirus 2 Ag: NEGATIVE

## 2021-09-06 MED ORDER — FEXOFENADINE HCL 180 MG PO TABS: 180.0000 mg | ORAL_TABLET | Freq: Every day | ORAL | 0 refills | Status: DC

## 2021-09-06 MED ORDER — PREDNISONE 20 MG PO TABS: ORAL_TABLET | ORAL | 0 refills | Status: DC

## 2021-09-06 NOTE — Discharge Instructions (Addendum)
Advised patient COVID-19 and influenza were both negative this evening.  Advised patient may alternate OTC Tylenol 1000 mg 1-2 times daily, as needed with OTC Ibuprofen 400 mg 1-2 times daily, as needed for fever and myalgias.  Advised patient to take medication as directed with food to completion starting tomorrow morning, Saturday, 09/07/2021.  Advised patient may take Allegra with prednisone burst for the next 5 days, then as needed for concurrent postnasal drainage/drip.

## 2021-09-06 NOTE — ED Provider Notes (Signed)
[facility]    CSN: [id number] Arrival date & time: 09/06/21  1638      History   Chief Complaint Chief Complaint  Patient presents with   Fever    Pt states that she has a fever, chills, nasal congestion, sneezing. X2 days    HPI Valerie Garza is a 50 y.o. female.   HPI 50 year old presents with chills, fever, headache, fatigue, nasal congestion and sneezing for 2 days.  Patient request rapid influenza and rapid COVID both of which are negative.  History reviewed. No pertinent past medical history.  There are no problems to display for this patient.   History reviewed. No pertinent surgical history.  OB History   No obstetric history on file.      Home Medications    Prior to Admission medications   Medication Sig Start Date End Date Taking? Authorizing Provider  Ascorbic Acid (VITAMIN C PO) Take by mouth.   Yes [provider]  fexofenadine (ALLEGRA ALLERGY) 180 MG tablet Take 1 tablet (180 mg total) by mouth daily for 15 days. 09/06/21 09/21/21 Yes Trevor Iha, FNP  predniSONE (DELTASONE) 20 MG tablet Take 3 tabs PO daily x 5 days. 09/06/21  Yes Trevor Iha, FNP    Family History Family History  Problem Relation Age of Onset   Thyroid disease Mother    Atrial fibrillation Father     Social History Social History   Tobacco Use   Smoking status: Never   Smokeless tobacco: Never  Substance Use Topics   Alcohol use: Never   Drug use: Never     Allergies   Latex   Review of Systems Review of Systems  Constitutional:  Positive for chills, fatigue and fever.  HENT:  Positive for congestion.   All other systems reviewed and are negative.   Physical Exam Triage Vital Signs ED Triage Vitals  Enc Vitals Group     BP 09/06/21 1734 115/78     Pulse Rate 09/06/21 1734 88     Resp 09/06/21 1734 18     Temp 09/06/21 1734 98.4 F (36.9 C)     Temp Source 09/06/21 1734 Oral     SpO2 09/06/21 1734 98 %     Weight  09/06/21 1725 144 lb (65.3 kg)     Height 09/06/21 1725 5' 8.5" (1.74 m)     Head Circumference --      Peak Flow --      Pain Score 09/06/21 1725 5     Pain Loc --      Pain Edu? --      Excl. in GC? --    No data found.  Updated Vital Signs BP 115/78 (BP Location: Right Arm)   Pulse 88   Temp 98.4 F (36.9 C) (Oral)   Resp 18   Ht 5' 8.5" (1.74 m)   Wt 144 lb (65.3 kg)   LMP 09/03/2021   SpO2 98%   BMI 21.58 kg/m   Physical Exam Vitals and nursing note reviewed.  Constitutional:      General: She is not in acute distress.    Appearance: Normal appearance. She is normal weight. She is not ill-appearing.  HENT:     Head: Normocephalic and atraumatic.     Right Ear: Tympanic membrane, ear canal and external ear normal.     Left Ear: Tympanic membrane, ear canal and external ear normal.     Nose:     Right Sinus: Frontal sinus  tenderness present.     Left Sinus: Frontal sinus tenderness present.     Mouth/Throat:     Mouth: Mucous membranes are moist.     Pharynx: Oropharynx is clear.     Comments: Moderate amount of clear drainage of posterior oropharynx noted Eyes:     Extraocular Movements: Extraocular movements intact.     Conjunctiva/sclera: Conjunctivae normal.     Pupils: Pupils are equal, round, and reactive to light.  Cardiovascular:     Rate and Rhythm: Normal rate and regular rhythm.     Pulses: Normal pulses.     Heart sounds: Normal heart sounds.  Pulmonary:     Effort: Pulmonary effort is normal.     Breath sounds: Normal breath sounds.  Musculoskeletal:     Cervical back: Normal range of motion and neck supple.  Skin:    General: Skin is warm and dry.  Neurological:     General: No focal deficit present.     Mental Status: She is alert and oriented to person, place, and time.     UC Treatments / Results  Labs (all labs ordered are listed, but only abnormal results are displayed) Labs Reviewed  POCT INFLUENZA A/B - Normal  POC SARS  CORONAVIRUS 2 AG -  ED - Normal  COVID-19, FLU A+B NAA    EKG   Radiology No results found.  Procedures Procedures (including critical care time)  Medications Ordered in UC Medications - No data to display  Initial Impression / Assessment and Plan / UC Course  I have reviewed the triage vital signs and the nursing notes.  Pertinent labs & imaging results that were available during my care of the patient were reviewed by me and considered in my medical decision making (see chart for details).    MDM: 1.  Fever-Advised patient may alternate OTC Tylenol 1000 mg 1-2 times daily, as needed with OTC Ibuprofen 400 mg 1-2 times daily, as needed for fever and myalgias. 2.  Sinus pressure-prednisone burst; 3.  Allergic rhinitis-Rx'd Allegra. Advised patient to take medication as directed with food to completion starting tomorrow morning, Saturday, 09/07/2021.  Advised patient may take Allegra with prednisone burst for the next 5 days, then as needed for concurrent postnasal drainage/drip.  Patient discharged home, hemodynamically stable.  Final Clinical Impressions(s) / UC Diagnoses   Final diagnoses:  Fever, unspecified  Sinus pressure  Allergic rhinitis, unspecified seasonality, unspecified trigger     Discharge Instructions      Advised patient COVID-19 and influenza were both negative this evening.  Advised patient may alternate OTC Tylenol 1000 mg 1-2 times daily, as needed with OTC Ibuprofen 400 mg 1-2 times daily, as needed for fever and myalgias.  Advised patient to take medication as directed with food to completion starting tomorrow morning, Saturday, 09/07/2021.  Advised patient may take Allegra with prednisone burst for the next 5 days, then as needed for concurrent postnasal drainage/drip.     ED Prescriptions     Medication Sig Dispense Auth. Provider   predniSONE (DELTASONE) 20 MG tablet Take 3 tabs PO daily x 5 days. 15 tablet Trevor Iha, FNP   fexofenadine  [facility] ALLERGY) 180 MG tablet Take 1 tablet (180 mg total) by mouth daily for 15 days. 15 tablet Trevor Iha, FNP      PDMP not reviewed this encounter.   Trevor Iha, FNP 09/06/21 [phone]

## 2021-09-06 NOTE — ED Triage Notes (Signed)
Pt states that she has some chills, fever, headache, fatigue, nasal congestion and sneezing. X2 days.   Pt states that she is vaccinated for covid.  Pt states that she has had her flu shot.

## 2021-09-07 LAB — COVID-19, FLU A+B NAA
Influenza A, NAA: NOT DETECTED
Influenza B, NAA: NOT DETECTED
SARS-CoV-2, NAA: NOT DETECTED

## 2021-09-09 ENCOUNTER — Telehealth: Payer: Self-pay

## 2021-09-09 NOTE — Telephone Encounter (Signed)
Telephone call from pt requesting lab results. HIPAA identifiers verified and pt notified lab results were negative.

## 2024-01-03 ENCOUNTER — Ambulatory Visit: Admission: EM | Admit: 2024-01-03 | Discharge: 2024-01-03 | Disposition: A | Discharge status: Home / Self Care

## 2024-01-03 ENCOUNTER — Other Ambulatory Visit: Payer: Self-pay

## 2024-01-03 DIAGNOSIS — J209 Acute bronchitis, unspecified: Secondary | ICD-10-CM

## 2024-01-03 MED ORDER — PREDNISONE 50 MG PO TABS: ORAL_TABLET | ORAL | 0 refills | Status: DC

## 2024-01-03 MED ORDER — DOXYCYCLINE HYCLATE 100 MG PO CAPS: 100.0000 mg | ORAL_CAPSULE | Freq: Two times a day (BID) | ORAL | 0 refills | Status: AC

## 2024-01-03 MED ORDER — PROMETHAZINE-DM 6.25-15 MG/5ML PO SYRP
5.0000 mL | ORAL_SOLUTION | Freq: Four times a day (QID) | ORAL | 0 refills | Status: AC | PRN
Start: 2024-01-03 — End: ?

## 2024-01-03 MED ORDER — FEXOFENADINE HCL 60 MG PO TABS: 60.0000 mg | ORAL_TABLET | Freq: Two times a day (BID) | ORAL | 0 refills | Status: AC

## 2024-01-03 MED ORDER — FEXOFENADINE HCL 180 MG PO TABS: 180.0000 mg | ORAL_TABLET | Freq: Every day | ORAL | 0 refills | Status: DC

## 2024-01-03 NOTE — Discharge Instructions (Signed)
 Continue to drink lots of water Take the doxycycline 2 times a day.  It is important to take this medicine with food May take over-the-counter cough medicine during the day.  At night take Phenergan DM.  This should cause drowsiness Take the prednisone once a day for 5 days See your doctor if not improving by next week

## 2024-01-03 NOTE — ED Triage Notes (Addendum)
 X 9 days has had cough and congestion. Cough worse at night. Green mucus when blowing nose. No fever recorded but felt feverish at first when her illness started. Has taken mucinex, vick's nighttime medication.

## 2024-01-03 NOTE — ED Provider Notes (Signed)
 [facility]    CSN: [id number] Arrival date & time: 01/03/24  0816      History   Chief Complaint Chief Complaint  Patient presents with   Cough    HPI Valerie Garza is a 53 y.o. female.   HPI  Patient is here for an upper respiratory infection.  She states that she has been sick for 9 days.  She has a cough.  Is not getting better.  She is coughing up sputum.  The cough keeps her awake at night.  She states she started to feel very tired.  No underlying lung disease or asthma.  Non-smoker.  Mild sinus congestion and postnasal drip  History reviewed. No pertinent past medical history.  There are no active problems to display for this patient.   Past Surgical History:  Procedure Laterality Date   BIOPSY BREAST     LEEP      OB History   No obstetric history on file.      Home Medications    Prior to Admission medications   Medication Sig Start Date End Date Taking? Authorizing Provider  ascorbic acid (VITAMIN C) 500 MG tablet Take 500 mg by mouth daily.   Yes [provider]  cholecalciferol (VITAMIN D3) 25 MCG (1000 UNIT) tablet Take 1,000 Units by mouth daily.   Yes [provider]  doxycycline (VIBRAMYCIN) 100 MG capsule Take 1 capsule (100 mg total) by mouth 2 (two) times daily. 01/03/24  Yes Eustace Moore, MD  fexofenadine (ALLEGRA) 60 MG tablet Take 1 tablet (60 mg total) by mouth 2 (two) times daily. 01/03/24  Yes Eustace Moore, MD  lactobacillus acidophilus (BACID) TABS tablet Take 2 tablets by mouth 3 (three) times daily.   Yes [provider]  Multiple Vitamin (MULTIVITAMIN WITH MINERALS) TABS tablet Take 1 tablet by mouth daily.   Yes [provider]  predniSONE (DELTASONE) 50 MG tablet Take once a day for 5 days.  Take with food 01/03/24  Yes Eustace Moore, MD  promethazine-dextromethorphan (PROMETHAZINE-DM) 6.25-15 MG/5ML syrup Take 5 mLs by mouth 4 (four) times daily as needed for cough.  01/03/24  Yes Eustace Moore, MD    Family History Family History  Problem Relation Age of Onset   Thyroid disease Mother    Atrial fibrillation Father     Social History Social History   Tobacco Use   Smoking status: Never   Smokeless tobacco: Never  Substance Use Topics   Alcohol use: Never   Drug use: Never     Allergies   Latex   Review of Systems Review of Systems See HPI  Physical Exam Triage Vital Signs ED Triage Vitals  Encounter Vitals Group     BP 01/03/24 0821 121/74     Systolic BP Percentile --      Diastolic BP Percentile --      Pulse Rate 01/03/24 0821 74     Resp 01/03/24 0821 16     Temp 01/03/24 0821 98.2 F (36.8 C)     Temp src --      SpO2 01/03/24 0821 94 %     Weight --      Height --      Head Circumference --      Peak Flow --      Pain Score 01/03/24 0825 2     Pain Loc --      Pain Education --      Exclude from  Growth Chart --    No data found.  Updated Vital Signs BP 121/74   Pulse 74   Temp 98.2 F (36.8 C)   Resp 16   LMP 10/21/2023 (Approximate)   SpO2 94%       Physical Exam Constitutional:      General: She is not in acute distress.    Appearance: Normal appearance. She is well-developed.  HENT:     Head: Normocephalic and atraumatic.     Right Ear: Tympanic membrane normal.     Left Ear: Tympanic membrane normal.     Nose: Nose normal.     Mouth/Throat:     Mouth: Mucous membranes are moist.  Eyes:     Conjunctiva/sclera: Conjunctivae normal.     Pupils: Pupils are equal, round, and reactive to light.  Cardiovascular:     Rate and Rhythm: Normal rate.  Pulmonary:     Effort: Pulmonary effort is normal. No respiratory distress.     Breath sounds: Rhonchi present.  Abdominal:     General: There is no distension.     Palpations: Abdomen is soft.  Musculoskeletal:        General: Normal range of motion.     Cervical back: Normal range of motion.  Skin:    General: Skin is warm and dry.   Neurological:     Mental Status: She is alert.      UC Treatments / Results  Labs (all labs ordered are listed, but only abnormal results are displayed) Labs Reviewed - No data to display  EKG   Radiology No results found.  Procedures Procedures (including critical care time)  Medications Ordered in UC Medications - No data to display  Initial Impression / Assessment and Plan / UC Course  I have reviewed the triage vital signs and the nursing notes.  Pertinent labs & imaging results that were available during my care of the patient were reviewed by me and considered in my medical decision making (see chart for details).     Concern for 9 days of illness with worsening symptoms.  Will cover with antibiotics Final Clinical Impressions(s) / UC Diagnoses   Final diagnoses:  Acute bronchitis, unspecified organism     Discharge Instructions      Continue to drink lots of water Take the doxycycline 2 times a day.  It is important to take this medicine with food May take over-the-counter cough medicine during the day.  At night take Phenergan DM.  This should cause drowsiness Take the prednisone once a day for 5 days See your doctor if not improving by next week    ED Prescriptions     Medication Sig Dispense Auth. Provider   doxycycline (VIBRAMYCIN) 100 MG capsule Take 1 capsule (100 mg total) by mouth 2 (two) times daily. 14 capsule Eustace Moore, MD   promethazine-dextromethorphan (PROMETHAZINE-DM) 6.25-15 MG/5ML syrup Take 5 mLs by mouth 4 (four) times daily as needed for cough. 118 mL Eustace Moore, MD   predniSONE (DELTASONE) 50 MG tablet Take once a day for 5 days.  Take with food 5 tablet Eustace Moore, MD      PDMP not reviewed this encounter.   Eustace Moore, MD 01/03/24 1004

## 2024-02-24 ENCOUNTER — Ambulatory Visit
Admission: RE | Admit: 2024-02-24 | Discharge: 2024-02-24 | Disposition: A | Discharge status: Home / Self Care | Payer: Self-pay | Source: Ambulatory Visit | Attending: Family Medicine | Admitting: Family Medicine

## 2024-02-24 VITALS — BP 110/72 | Temp 99.0°F | Resp 16

## 2024-02-24 DIAGNOSIS — L237 Allergic contact dermatitis due to plants, except food: Secondary | ICD-10-CM | POA: Diagnosis not present

## 2024-02-24 DIAGNOSIS — L2989 Other pruritus: Secondary | ICD-10-CM | POA: Diagnosis not present

## 2024-02-24 MED ORDER — METHYLPREDNISOLONE ACETATE 80 MG/ML IJ SUSP
80.0000 mg | Freq: Once | INTRAMUSCULAR | Status: AC
  Administered 2024-02-24: 80 mg via INTRAMUSCULAR

## 2024-02-24 MED ORDER — PREDNISONE 10 MG (21) PO TBPK: ORAL_TABLET | Freq: Every day | ORAL | 0 refills | Status: AC

## 2024-02-24 NOTE — ED Provider Notes (Signed)
 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  My skin is ultra sensitive to it. - Entered by patient    HPI Valerie Garza is a 53 y.o. female.   HPI Pleasant 53 year old female presents with poison ivy of neck bilateral arms and bilateral legs for 1 day.  Patient is concerned with poison ivy.  History reviewed. No pertinent past medical history.  There are no active problems to display for this patient.   Past Surgical History:  Procedure Laterality Date   BIOPSY BREAST     LEEP      OB History   No obstetric history on file.      Home Medications    Prior to Admission medications   Medication Sig Start Date End Date Taking? Authorizing Provider  predniSONE  (STERAPRED UNI-PAK 21 TAB) 10 MG (21) TBPK tablet Take by mouth daily. Take 6 tabs by mouth daily  for 2 days, then 5 tabs for 2 days, then 4 tabs for 2 days, then 3 tabs for 2 days, 2 tabs for 2 days, then 1 tab by mouth daily for 2 days 02/24/24  Yes Leonides Ramp, FNP  ascorbic acid (VITAMIN C) 500 MG tablet Take 500 mg by mouth daily.    [provider]  cholecalciferol (VITAMIN D3) 25 MCG (1000 UNIT) tablet Take 1,000 Units by mouth daily.    [provider]  doxycycline  (VIBRAMYCIN ) 100 MG capsule Take 1 capsule (100 mg total) by mouth 2 (two) times daily. 01/03/24   Stephany Ehrich, MD  fexofenadine  (ALLEGRA ) 60 MG tablet Take 1 tablet (60 mg total) by mouth 2 (two) times daily. 01/03/24   Stephany Ehrich, MD  lactobacillus acidophilus (BACID) TABS tablet Take 2 tablets by mouth 3 (three) times daily.    [provider]  Multiple Vitamin (MULTIVITAMIN WITH MINERALS) TABS tablet Take 1 tablet by mouth daily.    [provider]  promethazine -dextromethorphan (PROMETHAZINE -DM) 6.25-15  MG/5ML syrup Take 5 mLs by mouth 4 (four) times daily as needed for cough. 01/03/24   Stephany Ehrich, MD    Family History Family History  Problem Relation Age of Onset   Thyroid disease Mother    Atrial fibrillation Father     Social History Social History   Tobacco Use   Smoking status: Never   Smokeless tobacco: Never  Substance Use Topics   Alcohol use: Never   Drug use: Never     Allergies   Latex   Review of Systems Review of Systems  Skin:  Positive for rash.  All other systems reviewed and are negative.    Physical Exam Triage Vital Signs ED Triage Vitals  Encounter Vitals Group     BP 02/24/24 0834 110/72     Systolic BP Percentile --      Diastolic BP Percentile --      Pulse --      Resp 02/24/24 0834 16     Temp 02/24/24 0834 99 F (37.2 C)     Temp src --      SpO2 02/24/24 0834 98 %     Weight --      Height --      Head Circumference --  Peak Flow --      Pain Score 02/24/24 0832 3     Pain Loc --      Pain Education --      Exclude from Growth Chart --    No data found.  Updated Vital Signs BP 110/72   Temp 99 F (37.2 C)   Resp 16   SpO2 98%   Physical Exam Vitals and nursing note reviewed.  Constitutional:      General: She is not in acute distress.    Appearance: Normal appearance. She is normal weight. She is not ill-appearing.  HENT:     Head: Normocephalic and atraumatic.     Mouth/Throat:     Mouth: Mucous membranes are moist.     Pharynx: Oropharynx is clear.  Eyes:     Conjunctiva/sclera: Conjunctivae normal.     Pupils: Pupils are equal, round, and reactive to light.  Cardiovascular:     Rate and Rhythm: Normal rate and regular rhythm.     Pulses: Normal pulses.     Heart sounds: Normal heart sounds.  Pulmonary:     Effort: Pulmonary effort is normal.     Breath sounds: Normal breath sounds. No wheezing, rhonchi or rales.  Musculoskeletal:        General: Normal range of motion.     Cervical back:  Normal range of motion and neck supple.  Skin:    General: Skin is warm and dry.     Comments: Face/neck/left upper arm: Pruritic, erythematous, macular papular eruption with tiny vesicles noted-please see images below  Neurological:     General: No focal deficit present.     Mental Status: She is alert and oriented to person, place, and time. Mental status is at baseline.         UC Treatments / Results  Labs (all labs ordered are listed, but only abnormal results are displayed) Labs Reviewed - No data to display  EKG   Radiology No results found.  Procedures Procedures (including critical care time)  Medications Ordered in UC Medications  methylPREDNISolone acetate (DEPO-MEDROL) injection 80 mg (80 mg Intramuscular Given 02/24/24 0859)    Initial Impression / Assessment and Plan / UC Course  I have reviewed the triage vital signs and the nursing notes.  Pertinent labs & imaging results that were available during my care of the patient were reviewed by me and considered in my medical decision making (see chart for details).     MDM: 1.  Poison ivy dermatitis-IM Depo-Medrol 80 mg given once in clinic and prior to discharge; 2.  Pruritic erythematous rash-Rx'd Sterapred Unipak 21 tab 10 mg taper. Advised patient to take medications as directed with food to completion.  Encouraged to increase daily water intake to 64 ounces per day while taking this medication.  Encouraged patient to change bed linens for the next 2 to 3 days to avoid recontamination.  Advised if symptoms worsen and/or unresolved please follow-up with your PCP or here for further evaluation.  Final Clinical Impressions(s) / UC Diagnoses   Final diagnoses:  Poison ivy dermatitis  Pruritic erythematous rash     Discharge Instructions      Advised patient to take medications as directed with food to completion.  Encouraged to increase daily water intake to 64 ounces per day while taking this medication.   Encouraged patient to change bed linens for the next 2 to 3 days to avoid recontamination.  Advised if symptoms worsen and/or unresolved please follow-up  with your PCP or here for further evaluation.     ED Prescriptions     Medication Sig Dispense Auth. Provider   predniSONE  (STERAPRED UNI-PAK 21 TAB) 10 MG (21) TBPK tablet Take by mouth daily. Take 6 tabs by mouth daily  for 2 days, then 5 tabs for 2 days, then 4 tabs for 2 days, then 3 tabs for 2 days, 2 tabs for 2 days, then 1 tab by mouth daily for 2 days 42 tablet Leonides Ramp, FNP      PDMP not reviewed this encounter.   Leonides Ramp, FNP 02/24/24 1052

## 2024-02-24 NOTE — ED Triage Notes (Signed)
 Pt presents to [facility] with co of poison oak since since yesterday. Pt reports bilateral legs, arms and neck have it.

## 2024-02-24 NOTE — Discharge Instructions (Addendum)
 Advised patient to take medications as directed with food to completion.  Encouraged to increase daily water intake to 64 ounces per day while taking this medication.  Encouraged patient to change bed linens for the next 2 to 3 days to avoid recontamination.  Advised if symptoms worsen and/or unresolved please follow-up with your PCP or here for further evaluation.
